# Patient Record
Sex: Male | Born: 1950
Health system: Southern US, Community
[De-identification: ages and names within clinical notes are randomized; demographics above are authoritative.]

## PROBLEM LIST (undated history)

## (undated) DIAGNOSIS — I219 Acute myocardial infarction, unspecified: Secondary | ICD-10-CM

## (undated) DIAGNOSIS — M1990 Unspecified osteoarthritis, unspecified site: Secondary | ICD-10-CM

## (undated) DIAGNOSIS — E669 Obesity, unspecified: Secondary | ICD-10-CM

## (undated) DIAGNOSIS — I2510 Atherosclerotic heart disease of native coronary artery without angina pectoris: Secondary | ICD-10-CM

## (undated) DIAGNOSIS — I10 Essential (primary) hypertension: Secondary | ICD-10-CM

## (undated) DIAGNOSIS — I739 Peripheral vascular disease, unspecified: Secondary | ICD-10-CM

## (undated) DIAGNOSIS — I259 Chronic ischemic heart disease, unspecified: Secondary | ICD-10-CM

## (undated) DIAGNOSIS — E785 Hyperlipidemia, unspecified: Secondary | ICD-10-CM

## (undated) DIAGNOSIS — I714 Abdominal aortic aneurysm, without rupture, unspecified: Secondary | ICD-10-CM

## (undated) HISTORY — DX: Hyperlipidemia, unspecified: E78.5

## (undated) HISTORY — PX: KNEE SURGERY: SHX244

## (undated) HISTORY — DX: Chronic ischemic heart disease, unspecified: I25.9

## (undated) HISTORY — DX: Unspecified osteoarthritis, unspecified site: M19.90

## (undated) HISTORY — DX: Obesity, unspecified: E66.9

## (undated) HISTORY — DX: Essential (primary) hypertension: I10

## (undated) HISTORY — DX: Abdominal aortic aneurysm, without rupture, unspecified: I71.40

---

## 2000-02-18 ENCOUNTER — Other Ambulatory Visit: Admission: RE | Admit: 2000-02-18 | Discharge: 2000-02-18 | Payer: Self-pay | Admitting: Oral Surgery

## 2000-07-27 ENCOUNTER — Encounter: Payer: Self-pay | Admitting: Emergency Medicine

## 2000-07-27 ENCOUNTER — Inpatient Hospital Stay (HOSPITAL_COMMUNITY): Admission: EM | Admit: 2000-07-27 | Discharge: 2000-07-28 | Payer: Self-pay | Admitting: Emergency Medicine

## 2000-07-28 ENCOUNTER — Encounter: Payer: Self-pay | Admitting: Cardiology

## 2001-04-29 HISTORY — PX: CORONARY ARTERY BYPASS GRAFT: SHX141

## 2001-11-13 ENCOUNTER — Ambulatory Visit (HOSPITAL_COMMUNITY): Admission: RE | Admit: 2001-11-13 | Discharge: 2001-11-13 | Payer: Self-pay | Admitting: Cardiology

## 2001-12-02 ENCOUNTER — Encounter: Payer: Self-pay | Admitting: Surgery

## 2001-12-04 ENCOUNTER — Encounter: Payer: Self-pay | Admitting: Surgery

## 2001-12-04 ENCOUNTER — Inpatient Hospital Stay (HOSPITAL_COMMUNITY): Admission: RE | Admit: 2001-12-04 | Discharge: 2001-12-08 | Payer: Self-pay | Admitting: Surgery

## 2001-12-05 ENCOUNTER — Encounter: Payer: Self-pay | Admitting: Thoracic Surgery (Cardiothoracic Vascular Surgery)

## 2001-12-05 ENCOUNTER — Encounter: Payer: Self-pay | Admitting: Surgery

## 2001-12-06 ENCOUNTER — Encounter: Payer: Self-pay | Admitting: Thoracic Surgery (Cardiothoracic Vascular Surgery)

## 2010-06-21 ENCOUNTER — Ambulatory Visit (INDEPENDENT_AMBULATORY_CARE_PROVIDER_SITE_OTHER): Payer: BC Managed Care – PPO | Admitting: Cardiology

## 2010-06-21 DIAGNOSIS — I2510 Atherosclerotic heart disease of native coronary artery without angina pectoris: Secondary | ICD-10-CM

## 2010-07-04 ENCOUNTER — Telehealth (INDEPENDENT_AMBULATORY_CARE_PROVIDER_SITE_OTHER): Payer: Self-pay | Admitting: Radiology

## 2010-07-05 ENCOUNTER — Ambulatory Visit (HOSPITAL_COMMUNITY): Payer: BC Managed Care – PPO | Attending: Cardiology

## 2010-07-05 ENCOUNTER — Encounter: Payer: Self-pay | Admitting: Cardiology

## 2010-07-05 DIAGNOSIS — R0989 Other specified symptoms and signs involving the circulatory and respiratory systems: Secondary | ICD-10-CM

## 2010-07-05 DIAGNOSIS — R0609 Other forms of dyspnea: Secondary | ICD-10-CM

## 2010-07-05 DIAGNOSIS — I2510 Atherosclerotic heart disease of native coronary artery without angina pectoris: Secondary | ICD-10-CM | POA: Insufficient documentation

## 2010-07-10 NOTE — Assessment & Plan Note (Signed)
Summary: Cardiology Nuclear Testing  Nuclear Med Background Indications for Stress Test: Evaluation for Ischemia, Graft Patency, PTCA Patency   History: Angioplasty, CABG, Heart Catheterization, Myocardial Infarction, Myocardial Perfusion Study  History Comments: '95 MI>Tx thrombolytics/PTCA; '03 CABG;  '09  MPS:no ischemia, EF=58%   Symptoms: DOE    Nuclear Pre-Procedure Cardiac Risk Factors: Family History - CAD, History of Smoking, Hypertension, Lipids Caffeine/Decaff Intake: None NPO After: 8:00 PM Lungs: Clear IV 0.9% NS with Angio Cath: 20g     IV Site: R Antecubital IV Started by: Bonnita Levan, RN Chest Size (in) 46     Height (in): 72 Weight (lb): 220 BMI: 29.95  Nuclear Med Study 1 or 2 day study:  1 day     Stress Test Type:  Stress Reading MD:  Willa Rough, MD     Referring MD:  Roger Shelter, MD Resting Radionuclide:  Technetium 62m Tetrofosmin     Resting Radionuclide Dose:  11 mCi  Stress Radionuclide:  Technetium 72m Tetrofosmin     Stress Radionuclide Dose:  33 mCi   Stress Protocol Exercise Time (min):  12:00 min     Max HR:  131 bpm     Predicted Max HR:  161 bpm  Max Systolic BP: 200 mm Hg     Percent Max HR:  81.37 %     METS: 13.7 Rate Pressure Product:  26200    Stress Test Technologist:  Rea College, CMA-N     Nuclear Technologist:  Doyne Keel, CNMT  Rest Procedure  Myocardial perfusion imaging was performed at rest 45 minutes following the intravenous administration of Technetium 28m Tetrofosmin.  Stress Procedure  The patient exercised for twelve minutes on the treadmill utilizing the Bruce protocol.  The patient stopped due to fatigue and denied any chest pain.  There were no diagnostic ST-T wave changes, only occasional PAC's.  He did have a mild hypertensive response to exercise, 200/87.  Technetium 20m Tetrofosmin was injected at peak exercise and myocardial perfusion imaging was performed after a brief delay.  QPS Raw Data Images:   Patient motion noted; appropriate software correction applied. Stress Images:  mild decrease in activity in the inferior wall. Rest Images:  Normal homogeneous uptake in all areas of the myocardium. Subtraction (SDS):  mild reversibility in the inferior wall. Transient Ischemic Dilatation:  .99  (Normal <1.22)  Lung/Heart Ratio:  .31  (Normal <0.45)  Quantitative Gated Spect Images QGS EDV:  113 ml QGS ESV:  46 ml QGS EF:  59 % QGS cine images:  Septal dyssynergy (CABG)  Findings Abnormal      Overall Impression  Exercise Capacity: Very good  BP Response: Hypertensive blood pressure response. Clinical Symptoms: No chest pain ECG Impression: No significant ST segment change suggestive of ischemia. Overall Impression Comments: The images show an area of mild inchemia in the inferior wall. The quantitative analysis does not show this as an abnormality. I can not rule out some inferior ischemia.

## 2010-07-10 NOTE — Progress Notes (Signed)
Summary: Nuclear Pre-Procedure  Phone Note Outgoing Call Call back at [phone]   Call placed by: Stanton Kidney, EMT-P,  July 04, 2010 11:02 AM Call placed to: Patient Action Taken: Phone Call Completed Reason for Call: Confirm/change Appt Summary of Call: Reviewed information on Myoview Information Sheet (see scanned document for further details).  Spoke with the patient. Stanton Kidney, EMT-P  July 04, 2010 11:02 AM      Nuclear Med Background Indications for Stress Test: Evaluation for Ischemia, Graft Patency, PTCA Patency   History: Angioplasty, CABG, Myocardial Infarction, Myocardial Perfusion Study  History Comments: '95 MI -Tx thrombolytics/ptca ; 8/03 Cabgx5; 1/09  MPS-EF=58% no isch.     Nuclear Pre-Procedure Cardiac Risk Factors: Family History - CAD, History of Smoking, Hypertension, Lipids Tech Comments: premature fam.hx.

## 2011-08-20 ENCOUNTER — Encounter: Payer: Self-pay | Admitting: *Deleted

## 2012-12-10 ENCOUNTER — Encounter: Payer: Self-pay | Admitting: Cardiology

## 2015-11-09 DIAGNOSIS — Z125 Encounter for screening for malignant neoplasm of prostate: Secondary | ICD-10-CM | POA: Diagnosis not present

## 2015-11-09 DIAGNOSIS — E782 Mixed hyperlipidemia: Secondary | ICD-10-CM | POA: Diagnosis not present

## 2015-11-09 DIAGNOSIS — R7301 Impaired fasting glucose: Secondary | ICD-10-CM | POA: Diagnosis not present

## 2015-11-14 DIAGNOSIS — R109 Unspecified abdominal pain: Secondary | ICD-10-CM | POA: Diagnosis not present

## 2015-11-14 DIAGNOSIS — R945 Abnormal results of liver function studies: Secondary | ICD-10-CM | POA: Diagnosis not present

## 2015-11-14 DIAGNOSIS — E782 Mixed hyperlipidemia: Secondary | ICD-10-CM | POA: Diagnosis not present

## 2015-11-14 DIAGNOSIS — R7301 Impaired fasting glucose: Secondary | ICD-10-CM | POA: Diagnosis not present

## 2015-11-14 DIAGNOSIS — I2510 Atherosclerotic heart disease of native coronary artery without angina pectoris: Secondary | ICD-10-CM | POA: Diagnosis not present

## 2016-04-26 DIAGNOSIS — R7301 Impaired fasting glucose: Secondary | ICD-10-CM | POA: Diagnosis not present

## 2016-04-26 DIAGNOSIS — E782 Mixed hyperlipidemia: Secondary | ICD-10-CM | POA: Diagnosis not present

## 2016-05-01 DIAGNOSIS — R945 Abnormal results of liver function studies: Secondary | ICD-10-CM | POA: Diagnosis not present

## 2016-05-01 DIAGNOSIS — Z0000 Encounter for general adult medical examination without abnormal findings: Secondary | ICD-10-CM | POA: Diagnosis not present

## 2016-05-01 DIAGNOSIS — E782 Mixed hyperlipidemia: Secondary | ICD-10-CM | POA: Diagnosis not present

## 2016-05-01 DIAGNOSIS — R7301 Impaired fasting glucose: Secondary | ICD-10-CM | POA: Diagnosis not present

## 2016-05-01 DIAGNOSIS — I2510 Atherosclerotic heart disease of native coronary artery without angina pectoris: Secondary | ICD-10-CM | POA: Diagnosis not present

## 2017-05-19 DIAGNOSIS — H524 Presbyopia: Secondary | ICD-10-CM | POA: Diagnosis not present

## 2017-05-19 DIAGNOSIS — H02831 Dermatochalasis of right upper eyelid: Secondary | ICD-10-CM | POA: Diagnosis not present

## 2017-05-19 DIAGNOSIS — H2513 Age-related nuclear cataract, bilateral: Secondary | ICD-10-CM | POA: Diagnosis not present

## 2017-05-19 DIAGNOSIS — H40053 Ocular hypertension, bilateral: Secondary | ICD-10-CM | POA: Diagnosis not present

## 2017-07-10 DIAGNOSIS — I2510 Atherosclerotic heart disease of native coronary artery without angina pectoris: Secondary | ICD-10-CM | POA: Diagnosis not present

## 2017-07-10 DIAGNOSIS — R945 Abnormal results of liver function studies: Secondary | ICD-10-CM | POA: Diagnosis not present

## 2017-07-10 DIAGNOSIS — Z125 Encounter for screening for malignant neoplasm of prostate: Secondary | ICD-10-CM | POA: Diagnosis not present

## 2017-07-10 DIAGNOSIS — I10 Essential (primary) hypertension: Secondary | ICD-10-CM | POA: Diagnosis not present

## 2017-07-10 DIAGNOSIS — R7301 Impaired fasting glucose: Secondary | ICD-10-CM | POA: Diagnosis not present

## 2017-07-16 DIAGNOSIS — E782 Mixed hyperlipidemia: Secondary | ICD-10-CM | POA: Diagnosis not present

## 2017-07-16 DIAGNOSIS — I252 Old myocardial infarction: Secondary | ICD-10-CM | POA: Diagnosis not present

## 2017-07-16 DIAGNOSIS — R7301 Impaired fasting glucose: Secondary | ICD-10-CM | POA: Diagnosis not present

## 2017-07-16 DIAGNOSIS — R945 Abnormal results of liver function studies: Secondary | ICD-10-CM | POA: Diagnosis not present

## 2017-07-16 DIAGNOSIS — Z0000 Encounter for general adult medical examination without abnormal findings: Secondary | ICD-10-CM | POA: Diagnosis not present

## 2017-07-16 DIAGNOSIS — I10 Essential (primary) hypertension: Secondary | ICD-10-CM | POA: Diagnosis not present

## 2017-10-01 DIAGNOSIS — R69 Illness, unspecified: Secondary | ICD-10-CM | POA: Diagnosis not present

## 2017-11-17 DIAGNOSIS — H40053 Ocular hypertension, bilateral: Secondary | ICD-10-CM | POA: Diagnosis not present

## 2017-12-15 DIAGNOSIS — E782 Mixed hyperlipidemia: Secondary | ICD-10-CM | POA: Diagnosis not present

## 2017-12-15 DIAGNOSIS — R945 Abnormal results of liver function studies: Secondary | ICD-10-CM | POA: Diagnosis not present

## 2017-12-15 DIAGNOSIS — R7301 Impaired fasting glucose: Secondary | ICD-10-CM | POA: Diagnosis not present

## 2017-12-17 DIAGNOSIS — E6609 Other obesity due to excess calories: Secondary | ICD-10-CM | POA: Diagnosis not present

## 2017-12-17 DIAGNOSIS — L219 Seborrheic dermatitis, unspecified: Secondary | ICD-10-CM | POA: Diagnosis not present

## 2017-12-17 DIAGNOSIS — E782 Mixed hyperlipidemia: Secondary | ICD-10-CM | POA: Diagnosis not present

## 2017-12-17 DIAGNOSIS — R7301 Impaired fasting glucose: Secondary | ICD-10-CM | POA: Diagnosis not present

## 2017-12-17 DIAGNOSIS — I2510 Atherosclerotic heart disease of native coronary artery without angina pectoris: Secondary | ICD-10-CM | POA: Diagnosis not present

## 2017-12-17 DIAGNOSIS — Z6829 Body mass index (BMI) 29.0-29.9, adult: Secondary | ICD-10-CM | POA: Diagnosis not present

## 2017-12-17 DIAGNOSIS — R945 Abnormal results of liver function studies: Secondary | ICD-10-CM | POA: Diagnosis not present

## 2018-02-04 DIAGNOSIS — R1032 Left lower quadrant pain: Secondary | ICD-10-CM | POA: Diagnosis not present

## 2018-02-04 DIAGNOSIS — Z1210 Encounter for screening for malignant neoplasm of intestinal tract, unspecified: Secondary | ICD-10-CM | POA: Diagnosis not present

## 2018-03-23 DIAGNOSIS — Z1211 Encounter for screening for malignant neoplasm of colon: Secondary | ICD-10-CM | POA: Diagnosis not present

## 2018-03-23 DIAGNOSIS — D126 Benign neoplasm of colon, unspecified: Secondary | ICD-10-CM | POA: Diagnosis not present

## 2018-03-23 DIAGNOSIS — K5730 Diverticulosis of large intestine without perforation or abscess without bleeding: Secondary | ICD-10-CM | POA: Diagnosis not present

## 2018-03-23 DIAGNOSIS — K635 Polyp of colon: Secondary | ICD-10-CM | POA: Diagnosis not present

## 2018-03-24 DIAGNOSIS — K635 Polyp of colon: Secondary | ICD-10-CM | POA: Diagnosis not present

## 2018-03-24 DIAGNOSIS — D126 Benign neoplasm of colon, unspecified: Secondary | ICD-10-CM | POA: Diagnosis not present

## 2018-06-24 DIAGNOSIS — E6609 Other obesity due to excess calories: Secondary | ICD-10-CM | POA: Diagnosis not present

## 2018-06-24 DIAGNOSIS — R945 Abnormal results of liver function studies: Secondary | ICD-10-CM | POA: Diagnosis not present

## 2018-06-24 DIAGNOSIS — E782 Mixed hyperlipidemia: Secondary | ICD-10-CM | POA: Diagnosis not present

## 2018-06-24 DIAGNOSIS — R7301 Impaired fasting glucose: Secondary | ICD-10-CM | POA: Diagnosis not present

## 2018-06-24 DIAGNOSIS — Z125 Encounter for screening for malignant neoplasm of prostate: Secondary | ICD-10-CM | POA: Diagnosis not present

## 2018-07-30 DIAGNOSIS — I252 Old myocardial infarction: Secondary | ICD-10-CM | POA: Diagnosis not present

## 2018-07-30 DIAGNOSIS — R7301 Impaired fasting glucose: Secondary | ICD-10-CM | POA: Diagnosis not present

## 2018-07-30 DIAGNOSIS — I2510 Atherosclerotic heart disease of native coronary artery without angina pectoris: Secondary | ICD-10-CM | POA: Diagnosis not present

## 2018-07-30 DIAGNOSIS — L219 Seborrheic dermatitis, unspecified: Secondary | ICD-10-CM | POA: Diagnosis not present

## 2018-07-30 DIAGNOSIS — Z0000 Encounter for general adult medical examination without abnormal findings: Secondary | ICD-10-CM | POA: Diagnosis not present

## 2018-07-30 DIAGNOSIS — E782 Mixed hyperlipidemia: Secondary | ICD-10-CM | POA: Diagnosis not present

## 2018-09-08 DIAGNOSIS — Z0000 Encounter for general adult medical examination without abnormal findings: Secondary | ICD-10-CM | POA: Diagnosis not present

## 2018-11-26 ENCOUNTER — Other Ambulatory Visit: Payer: Self-pay

## 2019-05-07 DIAGNOSIS — Z951 Presence of aortocoronary bypass graft: Secondary | ICD-10-CM | POA: Insufficient documentation

## 2019-05-07 DIAGNOSIS — Z0000 Encounter for general adult medical examination without abnormal findings: Secondary | ICD-10-CM | POA: Diagnosis not present

## 2019-05-07 DIAGNOSIS — I2510 Atherosclerotic heart disease of native coronary artery without angina pectoris: Secondary | ICD-10-CM | POA: Insufficient documentation

## 2019-05-07 DIAGNOSIS — Z23 Encounter for immunization: Secondary | ICD-10-CM | POA: Diagnosis not present

## 2019-05-07 DIAGNOSIS — E785 Hyperlipidemia, unspecified: Secondary | ICD-10-CM | POA: Insufficient documentation

## 2019-05-07 DIAGNOSIS — Z136 Encounter for screening for cardiovascular disorders: Secondary | ICD-10-CM | POA: Diagnosis not present

## 2019-05-27 ENCOUNTER — Other Ambulatory Visit: Payer: Self-pay | Admitting: Family Medicine

## 2019-05-27 DIAGNOSIS — Z136 Encounter for screening for cardiovascular disorders: Secondary | ICD-10-CM

## 2019-05-31 ENCOUNTER — Ambulatory Visit
Admission: RE | Admit: 2019-05-31 | Discharge: 2019-05-31 | Disposition: A | Discharge status: Home / Self Care | Payer: Medicare HMO | Source: Ambulatory Visit | Attending: Family Medicine | Admitting: Family Medicine

## 2019-05-31 DIAGNOSIS — Z136 Encounter for screening for cardiovascular disorders: Secondary | ICD-10-CM | POA: Diagnosis not present

## 2019-06-16 DIAGNOSIS — M545 Low back pain: Secondary | ICD-10-CM | POA: Diagnosis not present

## 2019-06-16 DIAGNOSIS — I714 Abdominal aortic aneurysm, without rupture: Secondary | ICD-10-CM | POA: Diagnosis not present

## 2019-06-20 ENCOUNTER — Ambulatory Visit: Payer: Medicare HMO | Attending: Internal Medicine

## 2019-06-20 DIAGNOSIS — Z23 Encounter for immunization: Secondary | ICD-10-CM

## 2019-06-20 NOTE — Progress Notes (Signed)
   Covid-19 Vaccination Clinic  Name:  Kjuan Blocher    MRN: [id number] DOB: [birth year]  06/20/2019  Mr. Seelman was observed post Covid-19 immunization for 15 minutes without incidence. He was provided with Vaccine Information Sheet and instruction to access the V-Safe system.   Mr. Berst was instructed to call 911 with any severe reactions post vaccine: Marland Kitchen Difficulty breathing  . Swelling of your face and throat  . A fast heartbeat  . A bad rash all over your body  . Dizziness and weakness    Immunizations Administered    Name Date Dose VIS Date Route   Pfizer COVID-19 Vaccine 06/20/2019 10:39 AM 0.3 mL 04/09/2019 Intramuscular   Manufacturer: [place]   Lot: [id number]   [place]: [id number]

## 2019-06-29 DIAGNOSIS — I2510 Atherosclerotic heart disease of native coronary artery without angina pectoris: Secondary | ICD-10-CM | POA: Diagnosis not present

## 2019-06-29 DIAGNOSIS — M25559 Pain in unspecified hip: Secondary | ICD-10-CM | POA: Diagnosis not present

## 2019-06-29 DIAGNOSIS — M545 Low back pain: Secondary | ICD-10-CM | POA: Diagnosis not present

## 2019-06-30 DIAGNOSIS — M5137 Other intervertebral disc degeneration, lumbosacral region: Secondary | ICD-10-CM | POA: Diagnosis not present

## 2019-06-30 DIAGNOSIS — M5136 Other intervertebral disc degeneration, lumbar region: Secondary | ICD-10-CM | POA: Diagnosis not present

## 2019-06-30 DIAGNOSIS — M47817 Spondylosis without myelopathy or radiculopathy, lumbosacral region: Secondary | ICD-10-CM | POA: Diagnosis not present

## 2019-06-30 DIAGNOSIS — M160 Bilateral primary osteoarthritis of hip: Secondary | ICD-10-CM | POA: Diagnosis not present

## 2019-06-30 DIAGNOSIS — M47816 Spondylosis without myelopathy or radiculopathy, lumbar region: Secondary | ICD-10-CM | POA: Diagnosis not present

## 2019-07-13 ENCOUNTER — Encounter: Payer: Self-pay | Admitting: Vascular Surgery

## 2019-07-13 ENCOUNTER — Ambulatory Visit (INDEPENDENT_AMBULATORY_CARE_PROVIDER_SITE_OTHER): Payer: Medicare HMO | Admitting: Vascular Surgery

## 2019-07-13 ENCOUNTER — Other Ambulatory Visit: Payer: Self-pay

## 2019-07-13 VITALS — BP 118/78 | HR 89 | Temp 97.2°F | Resp 20 | Ht 72.0 in | Wt 202.0 lb

## 2019-07-13 DIAGNOSIS — I714 Abdominal aortic aneurysm, without rupture, unspecified: Secondary | ICD-10-CM

## 2019-07-13 NOTE — Progress Notes (Signed)
Vascular and Vein Specialist of [facility]  Patient name: David Decker MRN: [id number] DOB: 01-19-1951 Sex: male  REASON FOR CONSULT: Discuss new diagnosis of abdominal aortic aneurysm  HPI: David Decker is a 69 y.o. male, who is here today for discussion of recent ultrasound demonstrating abdominal aortic aneurysm.  He had no known prior knowledge of this.  He has no history of aneurysms in his family.  He had severe coronary disease at an Josia Cueva age and had coronary artery bypass grafting in his Lauriel Helin 43s.  He has remained stable regarding this.  He has no history of peripheral vascular occlusive disease.  He is quite active.  Prior to Covid he was routinely going to the gym and he plays golf several times per week.  Past Medical History:  Diagnosis Date  . Arthritis   . HLD (hyperlipidemia)   . HTN (hypertension)   . Ischemic heart disease   . Obesity     Family History  Problem Relation Age of Onset  . Parkinsonism Other   . Heart attack Other     SOCIAL HISTORY: Social History   Socioeconomic History  . Marital status: Married    Spouse name: Not on file  . Number of children: 3  . Years of education: Not on file  . Highest education level: Not on file  Occupational History  . Occupation: Medical sales representative  Tobacco Use  . Smoking status: Current Some Day Smoker    Types: Cigars  . Smokeless tobacco: Never Used  Substance and Sexual Activity  . Alcohol use: Yes    Comment: occasionally  . Drug use: Not on file  . Sexual activity: Not on file  Other Topics Concern  . Not on file  Social History Narrative  . Not on file   Social Determinants of Health   Financial Resource Strain:   . Difficulty of Paying Living Expenses:   Food Insecurity:   . Worried About Charity fundraiser in the Last Year:   . Arboriculturist in the Last Year:   Transportation Needs:   . Film/video editor (Medical):   Marland Kitchen Lack of  Transportation (Non-Medical):   Physical Activity:   . Days of Exercise per Week:   . Minutes of Exercise per Session:   Stress:   . Feeling of Stress :   Social Connections:   . Frequency of Communication with Friends and Family:   . Frequency of Social Gatherings with Friends and Family:   . Attends Religious Services:   . Active Member of Clubs or Organizations:   . Attends Archivist Meetings:   Marland Kitchen Marital Status:   Intimate Partner Violence:   . Fear of Current or Ex-Partner:   . Emotionally Abused:   Marland Kitchen Physically Abused:   . Sexually Abused:     Allergies  Allergen Reactions  . Other Hives    walnuts  . Penicillins     Current Outpatient Medications  Medication Sig Dispense Refill  . aspirin 81 MG EC tablet Take by mouth.    Marland Kitchen ibuprofen (ADVIL) 200 MG tablet Take by mouth.    . rosuvastatin (CRESTOR) 20 MG tablet Take by mouth.    . cyclobenzaprine (FLEXERIL) 5 MG tablet Take by mouth.     No current facility-administered medications for this visit.    REVIEW OF SYSTEMS:  [X]  denotes positive finding, [ ]  denotes negative finding Cardiac  Comments:  Chest pain or chest pressure:  Shortness of breath upon exertion:    Short of breath when lying flat:    Irregular heart rhythm:        Vascular    Pain in calf, thigh, or hip brought on by ambulation:    Pain in feet at night that wakes you up from your sleep:     Blood clot in your veins:    Leg swelling:         Pulmonary    Oxygen at home:    Productive cough:     Wheezing:         Neurologic    Sudden weakness in arms or legs:     Sudden numbness in arms or legs:     Sudden onset of difficulty speaking or slurred speech:    Temporary loss of vision in one eye:     Problems with dizziness:         Gastrointestinal    Blood in stool:     Vomited blood:         Genitourinary    Burning when urinating:     Blood in urine:        Psychiatric    Major depression:         Hematologic     Bleeding problems:    Problems with blood clotting too easily:        Skin    Rashes or ulcers:        Constitutional    Fever or chills:      PHYSICAL EXAM: Vitals:   07/13/19 0937  BP: 118/78  Pulse: 89  Resp: 20  Temp: (!) 97.2 F (36.2 C)  SpO2: 99%  Weight: 91.6 kg  Height: 6' (1.829 m)    GENERAL: The patient is a well-nourished male, in no acute distress. The vital signs are documented above. CARDIOVASCULAR: Carotid arteries without bruits bilaterally.  2+ radial 2+ femoral 2+ popliteal and 2+ posterior tibial pulses bilaterally with no evidence of peripheral artery aneurysm. PULMONARY: There is good air exchange  ABDOMEN: Soft and non-tender.  I do not palpate an aneurysm MUSCULOSKELETAL: There are no major deformities or cyanosis. NEUROLOGIC: No focal weakness or paresthesias are detected. SKIN: There are no ulcers or rashes noted. PSYCHIATRIC: The patient has a normal affect.  DATA:  I reviewed his ultrasound.  This reveals a 4.6 cm abdominal aortic aneurysm.  He does have some ectasia of his common iliac arteries bilaterally at 1.3 on the right and 1.4 on the left  MEDICAL ISSUES: I had a very long discussion with the patient regarding this.  I explained that this is very common diagnosis with concern regarding expansion and possible rupture.  I did explain that our current recommendation for repair is 5 to 5-1/2 cm in diameter depending on age and other comorbidities.  I explained the importance of blood pressure control but that he does not need to restrict his activities otherwise.  I did explain symptoms of leaking aneurysm and the need to call 911 and present to [facility] immediately should this occur.  We will see him in 6 months with CT scan of his abdomen and pelvis.  If this shows no appreciable growth, will then follow him with ultrasound at 31-month intervals.   Rosetta Posner, MD FACS [facility] of [facility] Tel [phone] Pager [phone]

## 2019-07-14 ENCOUNTER — Ambulatory Visit: Payer: Medicare HMO | Attending: Internal Medicine

## 2019-07-14 DIAGNOSIS — Z23 Encounter for immunization: Secondary | ICD-10-CM

## 2019-07-14 NOTE — Progress Notes (Signed)
   Covid-19 Vaccination Clinic  Name:  David Decker    MRN: [id number] DOB: 1951/04/07  07/14/2019  [name] was observed post Covid-19 immunization for 15 minutes without incident. He was provided with Vaccine Information Sheet and instruction to access the V-Safe system.   [name] was instructed to call 911 with any severe reactions post vaccine: Marland Kitchen Difficulty breathing  . Swelling of face and throat  . A fast heartbeat  . A bad rash all over body  . Dizziness and weakness   Immunizations Administered    Name Date Dose VIS Date Route   Pfizer COVID-19 Vaccine 07/14/2019  9:07 AM 0.3 mL 04/09/2019 Intramuscular   Manufacturer: [place]   Lot: [id number]   [place]: [id number]

## 2019-08-03 DIAGNOSIS — R69 Illness, unspecified: Secondary | ICD-10-CM | POA: Diagnosis not present

## 2019-08-03 DIAGNOSIS — M778 Other enthesopathies, not elsewhere classified: Secondary | ICD-10-CM | POA: Diagnosis not present

## 2019-08-03 DIAGNOSIS — M545 Low back pain: Secondary | ICD-10-CM | POA: Diagnosis not present

## 2019-08-10 DIAGNOSIS — M5117 Intervertebral disc disorders with radiculopathy, lumbosacral region: Secondary | ICD-10-CM | POA: Diagnosis not present

## 2019-08-10 DIAGNOSIS — M4727 Other spondylosis with radiculopathy, lumbosacral region: Secondary | ICD-10-CM | POA: Diagnosis not present

## 2019-08-10 DIAGNOSIS — M4726 Other spondylosis with radiculopathy, lumbar region: Secondary | ICD-10-CM | POA: Diagnosis not present

## 2019-08-10 DIAGNOSIS — M5116 Intervertebral disc disorders with radiculopathy, lumbar region: Secondary | ICD-10-CM | POA: Diagnosis not present

## 2019-08-10 DIAGNOSIS — M545 Low back pain: Secondary | ICD-10-CM | POA: Diagnosis not present

## 2019-08-12 DIAGNOSIS — Z951 Presence of aortocoronary bypass graft: Secondary | ICD-10-CM | POA: Diagnosis not present

## 2019-08-12 DIAGNOSIS — E785 Hyperlipidemia, unspecified: Secondary | ICD-10-CM | POA: Diagnosis not present

## 2019-08-12 DIAGNOSIS — M545 Low back pain: Secondary | ICD-10-CM | POA: Diagnosis not present

## 2019-08-25 DIAGNOSIS — M5136 Other intervertebral disc degeneration, lumbar region: Secondary | ICD-10-CM | POA: Diagnosis not present

## 2019-08-25 DIAGNOSIS — M48061 Spinal stenosis, lumbar region without neurogenic claudication: Secondary | ICD-10-CM | POA: Diagnosis not present

## 2019-08-25 DIAGNOSIS — M4802 Spinal stenosis, cervical region: Secondary | ICD-10-CM | POA: Diagnosis not present

## 2019-08-25 DIAGNOSIS — M4722 Other spondylosis with radiculopathy, cervical region: Secondary | ICD-10-CM | POA: Diagnosis not present

## 2019-08-25 DIAGNOSIS — M5030 Other cervical disc degeneration, unspecified cervical region: Secondary | ICD-10-CM | POA: Diagnosis not present

## 2019-08-25 DIAGNOSIS — M4726 Other spondylosis with radiculopathy, lumbar region: Secondary | ICD-10-CM | POA: Diagnosis not present

## 2019-08-25 DIAGNOSIS — G894 Chronic pain syndrome: Secondary | ICD-10-CM | POA: Diagnosis not present

## 2019-08-25 DIAGNOSIS — M545 Low back pain: Secondary | ICD-10-CM | POA: Diagnosis not present

## 2019-08-31 DIAGNOSIS — M50321 Other cervical disc degeneration at C4-C5 level: Secondary | ICD-10-CM | POA: Diagnosis not present

## 2019-09-01 DIAGNOSIS — H2513 Age-related nuclear cataract, bilateral: Secondary | ICD-10-CM | POA: Diagnosis not present

## 2019-09-01 DIAGNOSIS — H524 Presbyopia: Secondary | ICD-10-CM | POA: Diagnosis not present

## 2019-09-01 DIAGNOSIS — H25013 Cortical age-related cataract, bilateral: Secondary | ICD-10-CM | POA: Diagnosis not present

## 2019-09-01 DIAGNOSIS — H52201 Unspecified astigmatism, right eye: Secondary | ICD-10-CM | POA: Diagnosis not present

## 2019-09-15 DIAGNOSIS — M4722 Other spondylosis with radiculopathy, cervical region: Secondary | ICD-10-CM | POA: Diagnosis not present

## 2019-09-15 DIAGNOSIS — M4726 Other spondylosis with radiculopathy, lumbar region: Secondary | ICD-10-CM | POA: Diagnosis not present

## 2019-09-15 DIAGNOSIS — M5136 Other intervertebral disc degeneration, lumbar region: Secondary | ICD-10-CM | POA: Diagnosis not present

## 2019-10-11 DIAGNOSIS — M4722 Other spondylosis with radiculopathy, cervical region: Secondary | ICD-10-CM | POA: Diagnosis not present

## 2019-10-11 DIAGNOSIS — M6281 Muscle weakness (generalized): Secondary | ICD-10-CM | POA: Diagnosis not present

## 2019-10-11 DIAGNOSIS — M5136 Other intervertebral disc degeneration, lumbar region: Secondary | ICD-10-CM | POA: Diagnosis not present

## 2019-10-11 DIAGNOSIS — M4726 Other spondylosis with radiculopathy, lumbar region: Secondary | ICD-10-CM | POA: Diagnosis not present

## 2019-10-19 DIAGNOSIS — M4722 Other spondylosis with radiculopathy, cervical region: Secondary | ICD-10-CM | POA: Diagnosis not present

## 2019-10-19 DIAGNOSIS — M6281 Muscle weakness (generalized): Secondary | ICD-10-CM | POA: Diagnosis not present

## 2019-10-19 DIAGNOSIS — M4726 Other spondylosis with radiculopathy, lumbar region: Secondary | ICD-10-CM | POA: Diagnosis not present

## 2019-10-19 DIAGNOSIS — M5136 Other intervertebral disc degeneration, lumbar region: Secondary | ICD-10-CM | POA: Diagnosis not present

## 2019-10-21 DIAGNOSIS — M5136 Other intervertebral disc degeneration, lumbar region: Secondary | ICD-10-CM | POA: Diagnosis not present

## 2019-10-21 DIAGNOSIS — M79642 Pain in left hand: Secondary | ICD-10-CM | POA: Diagnosis not present

## 2019-10-21 DIAGNOSIS — M5030 Other cervical disc degeneration, unspecified cervical region: Secondary | ICD-10-CM | POA: Diagnosis not present

## 2019-10-21 DIAGNOSIS — M25532 Pain in left wrist: Secondary | ICD-10-CM | POA: Diagnosis not present

## 2019-10-21 DIAGNOSIS — M4722 Other spondylosis with radiculopathy, cervical region: Secondary | ICD-10-CM | POA: Diagnosis not present

## 2019-10-21 DIAGNOSIS — M25531 Pain in right wrist: Secondary | ICD-10-CM | POA: Diagnosis not present

## 2019-10-21 DIAGNOSIS — M4726 Other spondylosis with radiculopathy, lumbar region: Secondary | ICD-10-CM | POA: Diagnosis not present

## 2019-10-21 DIAGNOSIS — M6281 Muscle weakness (generalized): Secondary | ICD-10-CM | POA: Diagnosis not present

## 2019-10-21 DIAGNOSIS — M79641 Pain in right hand: Secondary | ICD-10-CM | POA: Diagnosis not present

## 2019-10-25 DIAGNOSIS — M4722 Other spondylosis with radiculopathy, cervical region: Secondary | ICD-10-CM | POA: Diagnosis not present

## 2019-10-25 DIAGNOSIS — M6281 Muscle weakness (generalized): Secondary | ICD-10-CM | POA: Diagnosis not present

## 2019-10-25 DIAGNOSIS — M4726 Other spondylosis with radiculopathy, lumbar region: Secondary | ICD-10-CM | POA: Diagnosis not present

## 2019-10-25 DIAGNOSIS — M5136 Other intervertebral disc degeneration, lumbar region: Secondary | ICD-10-CM | POA: Diagnosis not present

## 2019-10-28 DIAGNOSIS — M4722 Other spondylosis with radiculopathy, cervical region: Secondary | ICD-10-CM | POA: Diagnosis not present

## 2019-10-28 DIAGNOSIS — M4726 Other spondylosis with radiculopathy, lumbar region: Secondary | ICD-10-CM | POA: Diagnosis not present

## 2019-10-28 DIAGNOSIS — M5136 Other intervertebral disc degeneration, lumbar region: Secondary | ICD-10-CM | POA: Diagnosis not present

## 2019-11-08 DIAGNOSIS — M7989 Other specified soft tissue disorders: Secondary | ICD-10-CM | POA: Diagnosis not present

## 2019-11-09 DIAGNOSIS — M4722 Other spondylosis with radiculopathy, cervical region: Secondary | ICD-10-CM | POA: Diagnosis not present

## 2019-11-09 DIAGNOSIS — M4802 Spinal stenosis, cervical region: Secondary | ICD-10-CM | POA: Diagnosis not present

## 2019-11-09 DIAGNOSIS — M5030 Other cervical disc degeneration, unspecified cervical region: Secondary | ICD-10-CM | POA: Diagnosis not present

## 2019-11-11 DIAGNOSIS — M5136 Other intervertebral disc degeneration, lumbar region: Secondary | ICD-10-CM | POA: Diagnosis not present

## 2019-11-11 DIAGNOSIS — M4722 Other spondylosis with radiculopathy, cervical region: Secondary | ICD-10-CM | POA: Diagnosis not present

## 2019-11-11 DIAGNOSIS — M4726 Other spondylosis with radiculopathy, lumbar region: Secondary | ICD-10-CM | POA: Diagnosis not present

## 2019-11-15 DIAGNOSIS — I2510 Atherosclerotic heart disease of native coronary artery without angina pectoris: Secondary | ICD-10-CM | POA: Diagnosis not present

## 2019-11-15 DIAGNOSIS — M130 Polyarthritis, unspecified: Secondary | ICD-10-CM | POA: Diagnosis not present

## 2019-11-15 DIAGNOSIS — M7501 Adhesive capsulitis of right shoulder: Secondary | ICD-10-CM | POA: Diagnosis not present

## 2019-11-15 DIAGNOSIS — D649 Anemia, unspecified: Secondary | ICD-10-CM | POA: Diagnosis not present

## 2019-11-15 DIAGNOSIS — M25559 Pain in unspecified hip: Secondary | ICD-10-CM | POA: Diagnosis not present

## 2019-11-15 DIAGNOSIS — R634 Abnormal weight loss: Secondary | ICD-10-CM | POA: Diagnosis not present

## 2019-11-15 DIAGNOSIS — E785 Hyperlipidemia, unspecified: Secondary | ICD-10-CM | POA: Diagnosis not present

## 2019-11-15 DIAGNOSIS — Z720 Tobacco use: Secondary | ICD-10-CM | POA: Diagnosis not present

## 2019-11-15 DIAGNOSIS — M7502 Adhesive capsulitis of left shoulder: Secondary | ICD-10-CM | POA: Diagnosis not present

## 2019-11-17 DIAGNOSIS — I714 Abdominal aortic aneurysm, without rupture: Secondary | ICD-10-CM | POA: Diagnosis not present

## 2019-11-17 DIAGNOSIS — R634 Abnormal weight loss: Secondary | ICD-10-CM | POA: Diagnosis not present

## 2019-11-17 DIAGNOSIS — K6389 Other specified diseases of intestine: Secondary | ICD-10-CM | POA: Diagnosis not present

## 2019-11-25 DIAGNOSIS — Z6822 Body mass index (BMI) 22.0-22.9, adult: Secondary | ICD-10-CM | POA: Diagnosis not present

## 2019-11-25 DIAGNOSIS — R5382 Chronic fatigue, unspecified: Secondary | ICD-10-CM | POA: Diagnosis not present

## 2019-11-25 DIAGNOSIS — M2550 Pain in unspecified joint: Secondary | ICD-10-CM | POA: Diagnosis not present

## 2019-11-26 DIAGNOSIS — R634 Abnormal weight loss: Secondary | ICD-10-CM | POA: Diagnosis not present

## 2019-11-26 DIAGNOSIS — R933 Abnormal findings on diagnostic imaging of other parts of digestive tract: Secondary | ICD-10-CM | POA: Diagnosis not present

## 2019-11-26 DIAGNOSIS — K5900 Constipation, unspecified: Secondary | ICD-10-CM | POA: Diagnosis not present

## 2019-11-30 DIAGNOSIS — R0981 Nasal congestion: Secondary | ICD-10-CM | POA: Diagnosis not present

## 2019-11-30 DIAGNOSIS — J029 Acute pharyngitis, unspecified: Secondary | ICD-10-CM | POA: Diagnosis not present

## 2019-12-07 DIAGNOSIS — M4722 Other spondylosis with radiculopathy, cervical region: Secondary | ICD-10-CM | POA: Diagnosis not present

## 2019-12-07 DIAGNOSIS — M4802 Spinal stenosis, cervical region: Secondary | ICD-10-CM | POA: Diagnosis not present

## 2019-12-07 DIAGNOSIS — M5030 Other cervical disc degeneration, unspecified cervical region: Secondary | ICD-10-CM | POA: Diagnosis not present

## 2019-12-09 ENCOUNTER — Telehealth: Payer: Self-pay | Admitting: Hematology

## 2019-12-09 NOTE — Telephone Encounter (Signed)
Received a new pt referral from [facility] rheumatology for for evaluation of weight loss and M-Spike on SPEP. David Decker has been scheduled to see Dr. Irene Limbo on 8/17 at 11am. Appt date and time has been given to the pt's wife.

## 2019-12-14 ENCOUNTER — Inpatient Hospital Stay: Payer: Medicare HMO | Attending: Hematology | Admitting: Hematology

## 2019-12-14 ENCOUNTER — Telehealth: Payer: Self-pay | Admitting: Hematology

## 2019-12-14 ENCOUNTER — Other Ambulatory Visit: Payer: Self-pay

## 2019-12-14 ENCOUNTER — Inpatient Hospital Stay: Payer: Medicare HMO

## 2019-12-14 VITALS — BP 139/90 | HR 83 | Temp 97.6°F | Resp 18 | Ht 72.0 in | Wt 189.7 lb

## 2019-12-14 DIAGNOSIS — R69 Illness, unspecified: Secondary | ICD-10-CM | POA: Diagnosis not present

## 2019-12-14 DIAGNOSIS — Z803 Family history of malignant neoplasm of breast: Secondary | ICD-10-CM

## 2019-12-14 DIAGNOSIS — D472 Monoclonal gammopathy: Secondary | ICD-10-CM

## 2019-12-14 DIAGNOSIS — Z951 Presence of aortocoronary bypass graft: Secondary | ICD-10-CM

## 2019-12-14 DIAGNOSIS — F17210 Nicotine dependence, cigarettes, uncomplicated: Secondary | ICD-10-CM | POA: Diagnosis not present

## 2019-12-14 LAB — CMP (CANCER CENTER ONLY)
ALT: 19 U/L (ref 0–44)
AST: 13 U/L — ABNORMAL LOW (ref 15–41)
Albumin: 3.9 g/dL (ref 3.5–5.0)
Alkaline Phosphatase: 55 U/L (ref 38–126)
Anion gap: 9 (ref 5–15)
BUN: 10 mg/dL (ref 8–23)
CO2: 28 mmol/L (ref 22–32)
Calcium: 10.3 mg/dL (ref 8.9–10.3)
Chloride: 103 mmol/L (ref 98–111)
Creatinine: 0.98 mg/dL (ref 0.61–1.24)
GFR, Est AFR Am: >60 mL/min (ref >60)
GFR, Estimated: >60 mL/min (ref >60)
Glucose, Bld: 147 mg/dL — ABNORMAL HIGH (ref 70–99)
Potassium: 4.1 mmol/L (ref 3.5–5.1)
Sodium: 140 mmol/L (ref 135–145)
Total Bilirubin: 0.6 mg/dL (ref 0.3–1.2)
Total Protein: 7.7 g/dL (ref 6.5–8.1)

## 2019-12-14 LAB — SEDIMENTATION RATE: Sed Rate: 53 mm/hr — ABNORMAL HIGH (ref 0–16)

## 2019-12-14 LAB — CBC WITH DIFFERENTIAL/PLATELET
Abs Immature Granulocytes: 0.12 10*3/uL — ABNORMAL HIGH (ref 0.00–0.07)
Basophils Absolute: 0.1 10*3/uL (ref 0.0–0.1)
Basophils Relative: 1 %
Eosinophils Absolute: 0.1 10*3/uL (ref 0.0–0.5)
Eosinophils Relative: 1 %
HCT: 46.1 % (ref 39.0–52.0)
Hemoglobin: 15.2 g/dL (ref 13.0–17.0)
Immature Granulocytes: 1 %
Lymphocytes Relative: 8 %
Lymphs Abs: 0.7 10*3/uL (ref 0.7–4.0)
MCH: 30.4 pg (ref 26.0–34.0)
MCHC: 33 g/dL (ref 30.0–36.0)
MCV: 92.2 fL (ref 80.0–100.0)
Monocytes Absolute: 0.4 10*3/uL (ref 0.1–1.0)
Monocytes Relative: 5 %
Neutro Abs: 7.3 10*3/uL (ref 1.7–7.7)
Neutrophils Relative %: 84 %
Platelets: 287 10*3/uL (ref 150–400)
RBC: 5 MIL/uL (ref 4.22–5.81)
RDW: 14 % (ref 11.5–15.5)
WBC: 8.6 10*3/uL (ref 4.0–10.5)
nRBC: 0 % (ref 0.0–0.2)

## 2019-12-14 LAB — LACTATE DEHYDROGENASE: LDH: 174 U/L (ref 98–192)

## 2019-12-14 NOTE — Telephone Encounter (Signed)
Scheduled per los. Declined printout  

## 2019-12-14 NOTE — Patient Instructions (Signed)
Thank you for choosing [facility] to provide your oncology and hematology care.   Should you have questions after your visit to the [facility] ([facility]), please contact this office at [phone] between 8:30 AM and 4:30 PM.  Voice mails left after 4:00 PM may not be returned until the following business day.  Calls received after 4:30 PM will be answered by an off-site Nurse Triage Line.    Prescription Refills:  Please have your pharmacy contact us directly for most prescription requests.  Contact the office directly for refills of narcotics (pain medications). Allow 48-72 hours for refills.  Appointments: Please contact the [facility] scheduling department [phone] for questions regarding [facility] appointment scheduling.  Contact the schedulers with any scheduling changes so that your appointment can be rescheduled in a timely manner.   Central Scheduling for [facility] [phone] - Call to schedule procedures such as PET scans, CT scans, MRI, Ultrasound, etc.  To afford each patient quality time with our providers, please arrive 30 minutes before your scheduled appointment time.  If you arrive late for your appointment, you may be asked to reschedule.  We strive to give you quality time with our providers, and arriving late affects you and other patients whose appointments are after yours. If you are a no show for multiple scheduled visits, you may be dismissed from the clinic at the providers discretion.     Resources: [facility] Social Workers [phone] for additional information on assistance programs or assistance connecting with community support programs   [place] DSS  [phone]: Information regarding food stamps, Medicaid, and utility assistance [place] Access [place] [phone]   [place] [employer]'s shared-ride transportation service for eligible riders who have a disability that prevents them from riding the fixed route bus.   Medicare  Rights Center 800-333-4114 Helps people with Medicare understand their rights and benefits, navigate the Medicare system, and secure the quality healthcare they deserve American [employer] 800-227-2345 Assists patients locate various types of support and financial assistance Cancer Care: 1-800-813-HOPE (4673) Provides financial assistance, online support groups, medication/co-pay assistance.   Transportation Assistance for appointments at [facility]: Transportation Coordinator [phone]  Again, thank you for choosing [facility] for your care.       

## 2019-12-14 NOTE — Progress Notes (Signed)
HEMATOLOGY/ONCOLOGY CONSULTATION NOTE  Date of Service: 12/14/2019  Patient Care Team: Bernerd Limbo, MD as PCP - General (Family Medicine)  CHIEF COMPLAINTS/PURPOSE OF CONSULTATION:  Weight loss & M-Spike  HISTORY OF PRESENTING ILLNESS:   David Decker is a wonderful 68 y.o. male who has been referred to [place] by Leafy Kindle, PA for evaluation and management of weight loss & M-Spike on SPEP. The pt reports that he is doing well overall.   The pt reports that in January he began having difficulty walking. They completed scans and it was discovered that he had DDD. Pt was given steroid injections, which helped his mobility for awhile. Some time after, his joints began to stiffen and become painful. Pt was taking Ibuprofen to manage his symptoms. His joint pain finally improved after he began Prednisone. Pt is following with Dr. Annamaria Boots for Rheumatology. They have not diagnosed the pt with Rheumatoid Arthritis, but are concerned that he has an autoimmune condition. Pt is at the end of a Prednisone taper and has been taking 1 mg for the last week. Pt lost about 30 lbs prior to starting Prednisone. He has since gained back 9 lbs. Dr. Annamaria Boots is considering starting pt on Methotrexate, but is waiting until pt completes his upcoming Colonoscopy.   Most recent lab results (11/15/2019) of CBC w/diff & CMP is as follows: all values are WNL except for Lymphs Rel at 16.8, Granulocytes Rel at 74.9, Granulocytes Abs at 8.2K, RBC at 4.38, Hgb at 12.9, HCT at 39.2, Glucose at 110, Creatinine at 0.70. 11/25/2019 SPEP is as follows: all values are WNL except for M-Spike at 0.3.  On review of systems, pt reports shoulder pain and denies joint pain, abdominal pain, constipation, diarrhea, leg swelling and any other symptoms.   On PMHx the pt reports Arthritis, HLD, HTN, Ischemic heart disease, CABG, DDD. On Family Hx the pt reports that his sister passed from Breast Cancer at the age of 48. No known autoimmune,  rheumatologic, or blood disorders in the family.   MEDICAL HISTORY:  Past Medical History:  Diagnosis Date  . Arthritis   . HLD (hyperlipidemia)   . HTN (hypertension)   . Ischemic heart disease   . Obesity     SURGICAL HISTORY: Past Surgical History:  Procedure Laterality Date  . CORONARY ARTERY BYPASS GRAFT  2003  . KNEE SURGERY      SOCIAL HISTORY: Social History   Socioeconomic History  . Marital status: Married    Spouse name: Not on file  . Number of children: 3  . Years of education: Not on file  . Highest education level: Not on file  Occupational History  . Occupation: Medical sales representative  Tobacco Use  . Smoking status: Current Some Day Smoker    Types: Cigars  . Smokeless tobacco: Never Used  Vaping Use  . Vaping Use: Never used  Substance and Sexual Activity  . Alcohol use: Yes    Comment: occasionally  . Drug use: Not on file  . Sexual activity: Not on file  Other Topics Concern  . Not on file  Social History Narrative  . Not on file   Social Determinants of Health   Financial Resource Strain:   . Difficulty of Paying Living Expenses:   Food Insecurity:   . Worried About Charity fundraiser in the Last Year:   . Arboriculturist in the Last Year:   Transportation Needs:   . Film/video editor (Medical):   Marland Kitchen  Lack of Transportation (Non-Medical):   Physical Activity:   . Days of Exercise per Week:   . Minutes of Exercise per Session:   Stress:   . Feeling of Stress :   Social Connections:   . Frequency of Communication with Friends and Family:   . Frequency of Social Gatherings with Friends and Family:   . Attends Religious Services:   . Active Member of Clubs or Organizations:   . Attends Archivist Meetings:   Marland Kitchen Marital Status:   Intimate Partner Violence:   . Fear of Current or Ex-Partner:   . Emotionally Abused:   Marland Kitchen Physically Abused:   . Sexually Abused:     FAMILY HISTORY: Family History  Problem Relation  Age of Onset  . Parkinsonism Other   . Heart attack Other     ALLERGIES:  is allergic to other and penicillins.  MEDICATIONS:  Current Outpatient Medications  Medication Sig Dispense Refill  . aspirin 81 MG EC tablet Take by mouth.    . cyclobenzaprine (FLEXERIL) 5 MG tablet Take by mouth.    Marland Kitchen ibuprofen (ADVIL) 200 MG tablet Take by mouth.    . rosuvastatin (CRESTOR) 20 MG tablet Take by mouth.     No current facility-administered medications for this visit.    REVIEW OF SYSTEMS:    10 Point review of Systems was done is negative except as noted above.  PHYSICAL EXAMINATION: ECOG PERFORMANCE STATUS: 0 - Asymptomatic  . Vitals:   12/14/19 1107  BP: 139/90  Pulse: 83  Resp: 18  Temp: 97.6 F (36.4 C)  SpO2: 99%   Filed Weights   12/14/19 1107  Weight: 189 lb 11.2 oz (86 kg)   .Body mass index is 25.73 kg/m.  GENERAL:alert, in no acute distress and comfortable SKIN: no acute rashes, no significant lesions EYES: conjunctiva are pink and non-injected, sclera anicteric OROPHARYNX: MMM, no exudates, no oropharyngeal erythema or ulceration NECK: supple, no JVD LYMPH:  no palpable lymphadenopathy in the cervical, axillary or inguinal regions LUNGS: clear to auscultation b/l with normal respiratory effort HEART: regular rate & rhythm ABDOMEN:  normoactive bowel sounds , non tender, not distended. Extremity: no pedal edema PSYCH: alert & oriented x 3 with fluent speech NEURO: no focal motor/sensory deficits  LABORATORY DATA:  I have reviewed the data as listed  . CBC Latest Ref Rng & Units 12/14/2019  WBC 4.0 - 10.5 K/uL 8.6  Hemoglobin 13.0 - 17.0 g/dL 15.2  Hematocrit 39 - 52 % 46.1  Platelets 150 - 400 K/uL 287    . CMP Latest Ref Rng & Units 12/14/2019  Glucose 70 - 99 mg/dL 147(H)  BUN 8 - 23 mg/dL 10  Creatinine 0.61 - 1.24 mg/dL 0.98  Sodium 135 - 145 mmol/L 140  Potassium 3.5 - 5.1 mmol/L 4.1  Chloride 98 - 111 mmol/L 103  CO2 22 - 32 mmol/L 28    Calcium 8.9 - 10.3 mg/dL 10.3  Total Protein 6.5 - 8.1 g/dL 7.7  Total Bilirubin 0.3 - 1.2 mg/dL 0.6  Alkaline Phos 38 - 126 U/L 55  AST 15 - 41 U/L 13(L)  ALT 0 - 44 U/L 19     RADIOGRAPHIC STUDIES: I have personally reviewed the radiological images as listed and agreed with the findings in the report. No results found.  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 -Discussed 11/25/2019 SPEP is as follows: all values are WNL except for M-Spike at 0.3. -Advised pt that his M-Spike was not very high and could be due to a reactive process caused by an autoimmune condition. -Advised pt that an elevated M-Spike carries concern for plasma cell dyscrasias, but that is less likely his his case.   -Advised pt that a complete w/o for Multiple Myeloma would include labs, UPEP, whole body bone scan and BM Bx.  -Would not recommend complete w/o based on current symptoms and labs. Could always complete if concern increases.  -Pt has previously had bone scans of head, chest, and hips that did not reveal any bone lesions.  -He had no hypercalcemia or unexplained renal dysfunction on last labs.  -Advised pt that once underlying autoimmune is treated MGUS should resolve.  -No contraindications to pt beginning Methotrexate from my standpoint.  -Will get labs today - will repeat labs in 6 months to observe trend.  -Recommend pt f/u for Coloscopy as scheduled. -Recommend pt f/u with Dr. Annamaria Boots  -Will see back in 2-3 weeks via phone   FOLLOW UP: Labs today Phone visit with Dr Irene Limbo in 2-3 weeks. Okay to schedule end of day if no other slots available.   All of the patients questions were answered with apparent  satisfaction. The patient knows to call the clinic with any problems, questions or concerns.  I spent 35 mins counseling the patient face to face. The total time spent in the appointment was 45 minutes and more than 50% was on counseling and direct patient cares.    Sullivan Lone MD [place] AAHIVMS [facility] Hematology/Oncology Physician [facility]  (Office):       [phone] (Work cell):  [phone] (Fax):           [phone]  12/14/2019 2:15 PM  I, Yevette Edwards, am acting as a scribe for Dr. Sullivan Lone.   .I have reviewed the above documentation for accuracy and completeness, and I agree with the above. Brunetta Genera MD

## 2019-12-15 LAB — KAPPA/LAMBDA LIGHT CHAINS
Kappa free light chain: 18.1 mg/L (ref 3.3–19.4)
Kappa, lambda light chain ratio: 0.96 (ref 0.26–1.65)
Lambda free light chains: 18.8 mg/L (ref 5.7–26.3)

## 2019-12-15 LAB — BETA 2 MICROGLOBULIN, SERUM: Beta-2 Microglobulin: 1.4 mg/L (ref 0.6–2.4)

## 2019-12-17 LAB — MULTIPLE MYELOMA PANEL, SERUM
Albumin SerPl Elph-Mcnc: 4.1 g/dL (ref 2.9–4.4)
Albumin/Glob SerPl: 1.5 (ref 0.7–1.7)
Alpha 1: 0.2 g/dL (ref 0.0–0.4)
Alpha2 Glob SerPl Elph-Mcnc: 1 g/dL (ref 0.4–1.0)
B-Globulin SerPl Elph-Mcnc: 0.9 g/dL (ref 0.7–1.3)
Gamma Glob SerPl Elph-Mcnc: 0.8 g/dL (ref 0.4–1.8)
Globulin, Total: 2.9 g/dL (ref 2.2–3.9)
IgA: 222 mg/dL (ref 61–437)
IgG (Immunoglobin G), Serum: 860 mg/dL (ref 603–1613)
IgM (Immunoglobulin M), Srm: 56 mg/dL (ref 20–172)
M Protein SerPl Elph-Mcnc: 0.2 g/dL — ABNORMAL HIGH
Total Protein ELP: 7 g/dL (ref 6.0–8.5)

## 2020-01-03 NOTE — Progress Notes (Signed)
HEMATOLOGY/ONCOLOGY CONSULTATION NOTE  Date of Service: 01/04/2020  Patient Care Team: Bernerd Limbo, MD as PCP - General (Family Medicine)  CHIEF COMPLAINTS/PURPOSE OF CONSULTATION:  Weight loss & M-Spike  HISTORY OF PRESENTING ILLNESS:   David Decker is a wonderful 69 y.o. male who has been referred to [place] by Leafy Kindle, PA for evaluation and management of weight loss & M-Spike on SPEP. The pt reports that he is doing well overall.   The pt reports that in January he began having difficulty walking. They completed scans and it was discovered that he had DDD. Pt was given steroid injections, which helped his mobility for awhile. Some time after, his joints began to stiffen and become painful. Pt was taking Ibuprofen to manage his symptoms. His joint pain finally improved after he began Prednisone. Pt is following with Dr. Annamaria Boots for Rheumatology. They have not diagnosed the pt with Rheumatoid Arthritis, but are concerned that he has an autoimmune condition. Pt is at the end of a Prednisone taper and has been taking 1 mg for the last week. Pt lost about 30 lbs prior to starting Prednisone. He has since gained back 9 lbs. Dr. Annamaria Boots is considering starting pt on Methotrexate, but is waiting until pt completes his upcoming Colonoscopy.   Most recent lab results (11/15/2019) of CBC w/diff & CMP is as follows: all values are WNL except for Lymphs Rel at 16.8, Granulocytes Rel at 74.9, Granulocytes Abs at 8.2K, RBC at 4.38, Hgb at 12.9, HCT at 39.2, Glucose at 110, Creatinine at 0.70. 11/25/2019 SPEP is as follows: all values are WNL except for M-Spike at 0.3.  On review of systems, pt reports shoulder pain and denies joint pain, abdominal pain, constipation, diarrhea, leg swelling and any other symptoms.   On PMHx the pt reports Arthritis, HLD, HTN, Ischemic heart disease, CABG, DDD. On Family Hx the pt reports that his sister passed from Breast Cancer at the age of 79. No known autoimmune,  rheumatologic, or blood disorders in the family.   INTERVAL HISTORY:  I connected with  David Decker on 01/04/20 by telephone and verified that I am speaking with the correct person using two identifiers.   I discussed the limitations of evaluation and management by telemedicine. The patient expressed understanding and agreed to proceed.  Other persons participating in the visit and their role in the encounter:       -Yevette Edwards, Medical Scribe  Patient's location: Home Provider's location: [place] at [employer] is a wonderful 69 y.o. male who is here for evaluation and management of weight loss & M-Spike on SPEP. The patient's last visit with [place] was on 12/14/2019. The pt reports that he is doing well overall.  The pt reports that he had a Colonoscopy today. His Gastroenterologist found several polyps and pus. He is being prescribed antibiotics.   Lab results (12/14/19) of CBC w/diff and CMP is as follows: all values are WNL except for Abs Immature Granulocytes at 0.12K, Glucose at 147, AST at 13. 12/14/2019 LDH at 174 12/14/2019 Sed Rate at 53 12/14/2019 Beta-2 Microglobulin at 1.4 12/14/2019 K/L light chains show all values are WNL 12/14/2019 MMP is as follows: all values are WNL except for M Protein at 0.2  On review of systems, pt denies any other symptoms.   MEDICAL HISTORY:  Past Medical History:  Diagnosis Date  . Arthritis   . HLD (hyperlipidemia)   . HTN (hypertension)   . Ischemic heart disease   .  Obesity     SURGICAL HISTORY: Past Surgical History:  Procedure Laterality Date  . CORONARY ARTERY BYPASS GRAFT  2003  . KNEE SURGERY      SOCIAL HISTORY: Social History   Socioeconomic History  . Marital status: Married    Spouse name: Not on file  . Number of children: 3  . Years of education: Not on file  . Highest education level: Not on file  Occupational History  . Occupation: Medical sales representative  Tobacco Use  . Smoking  status: Current Some Day Smoker    Types: Cigars  . Smokeless tobacco: Never Used  Vaping Use  . Vaping Use: Never used  Substance and Sexual Activity  . Alcohol use: Yes    Comment: occasionally  . Drug use: Not on file  . Sexual activity: Not on file  Other Topics Concern  . Not on file  Social History Narrative  . Not on file   Social Determinants of Health   Financial Resource Strain:   . Difficulty of Paying Living Expenses: Not on file  Food Insecurity:   . Worried About Charity fundraiser in the Last Year: Not on file  . Ran Out of Food in the Last Year: Not on file  Transportation Needs:   . Lack of Transportation (Medical): Not on file  . Lack of Transportation (Non-Medical): Not on file  Physical Activity:   . Days of Exercise per Week: Not on file  . Minutes of Exercise per Session: Not on file  Stress:   . Feeling of Stress : Not on file  Social Connections:   . Frequency of Communication with Friends and Family: Not on file  . Frequency of Social Gatherings with Friends and Family: Not on file  . Attends Religious Services: Not on file  . Active Member of Clubs or Organizations: Not on file  . Attends Archivist Meetings: Not on file  . Marital Status: Not on file  Intimate Partner Violence:   . Fear of Current or Ex-Partner: Not on file  . Emotionally Abused: Not on file  . Physically Abused: Not on file  . Sexually Abused: Not on file    FAMILY HISTORY: Family History  Problem Relation Age of Onset  . Parkinsonism Other   . Heart attack Other     ALLERGIES:  is allergic to other and penicillins.  MEDICATIONS:  Current Outpatient Medications  Medication Sig Dispense Refill  . aspirin 81 MG EC tablet Take by mouth.    . clobetasol (TEMOVATE) 0.05 % external solution Apply topically 2 (two) times daily.    . cyclobenzaprine (FLEXERIL) 5 MG tablet Take by mouth. (Patient not taking: Reported on 12/14/2019)    . gabapentin (NEURONTIN) 300  MG capsule Take by mouth.    Marland Kitchen ibuprofen (ADVIL) 200 MG tablet Take by mouth.    . meloxicam (MOBIC) 15 MG tablet Take 15 mg by mouth daily.    Marland Kitchen oxyCODONE-acetaminophen (PERCOCET/ROXICET) 5-325 MG tablet Take 1 tablet by mouth every 6 (six) hours as needed.    . predniSONE (DELTASONE) 10 MG tablet Take 20 mg by mouth daily.    . predniSONE (DELTASONE) 5 MG tablet TAKE SIX TABLETS ONCE DAILY for 2 days, 5 for 2 days, 4 for 2 days, 3 for 2 days, 2 for 2 days 1 for 2 days (Patient not taking: Reported on 12/14/2019)    . rosuvastatin (CRESTOR) 20 MG tablet Take by mouth.    . traZODone (  DESYREL) 50 MG tablet take ONE-HALF TO ONE TABLET BY MOUTH EVERYDAY AT BEDTIME TO help SLEEP     No current facility-administered medications for this visit.    REVIEW OF SYSTEMS:   A 10+ POINT REVIEW OF SYSTEMS WAS OBTAINED including neurology, dermatology, psychiatry, cardiac, respiratory, lymph, extremities, GI, GU, Musculoskeletal, constitutional, breasts, reproductive, HEENT.  All pertinent positives are noted in the HPI.  All others are negative.   PHYSICAL EXAMINATION: ECOG PERFORMANCE STATUS: 0 - Asymptomatic  Telehealth visit   LABORATORY DATA:  I have reviewed the data as listed  . CBC Latest Ref Rng & Units 12/14/2019  WBC 4.0 - 10.5 K/uL 8.6  Hemoglobin 13.0 - 17.0 g/dL 15.2  Hematocrit 39 - 52 % 46.1  Platelets 150 - 400 K/uL 287    . CMP Latest Ref Rng & Units 12/14/2019  Glucose 70 - 99 mg/dL 147(H)  BUN 8 - 23 mg/dL 10  Creatinine 0.61 - 1.24 mg/dL 0.98  Sodium 135 - 145 mmol/L 140  Potassium 3.5 - 5.1 mmol/L 4.1  Chloride 98 - 111 mmol/L 103  CO2 22 - 32 mmol/L 28  Calcium 8.9 - 10.3 mg/dL 10.3  Total Protein 6.5 - 8.1 g/dL 7.7  Total Bilirubin 0.3 - 1.2 mg/dL 0.6  Alkaline Phos 38 - 126 U/L 55  AST 15 - 41 U/L 13(L)  ALT 0 - 44 U/L 19     RADIOGRAPHIC STUDIES: I have personally reviewed the radiological images as listed and agreed with the findings in the report. No  results found.  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  -Advised pt that treating his underlying autoimmune condition should improve his MGUS.  -Advised pt that there is a 1% chance of MGUS transformation into Multiple Myeloma every year. -Recommend pt check labwork in 6 months, and then annually with Dr. Coletta Memos.    -Will see back as needed   FOLLOW UP: RTC with Dr Irene Limbo as needed   The total time spent in the appt was 20 minutes and more than 50% was on counseling and direct patient cares.  All of the patient's questions were answered with apparent satisfaction. The patient knows to call the clinic with any problems, questions or concerns.    Sullivan Lone MD [place] AAHIVMS [facility] Hematology/Oncology Physician [facility]  (Office):       [phone] (Work cell):  [phone] (Fax):           [phone]  01/04/2020 4:55 PM  I, Yevette Edwards, am acting as a scribe for Dr. Sullivan Lone.   .I have reviewed the above documentation for accuracy and completeness, and I agree with the above. Brunetta Genera MD

## 2020-01-04 ENCOUNTER — Inpatient Hospital Stay: Payer: Medicare HMO | Attending: Hematology | Admitting: Hematology

## 2020-01-04 DIAGNOSIS — K635 Polyp of colon: Secondary | ICD-10-CM | POA: Diagnosis not present

## 2020-01-04 DIAGNOSIS — R933 Abnormal findings on diagnostic imaging of other parts of digestive tract: Secondary | ICD-10-CM | POA: Diagnosis not present

## 2020-01-04 DIAGNOSIS — K5730 Diverticulosis of large intestine without perforation or abscess without bleeding: Secondary | ICD-10-CM | POA: Diagnosis not present

## 2020-01-04 DIAGNOSIS — D472 Monoclonal gammopathy: Secondary | ICD-10-CM

## 2020-01-04 DIAGNOSIS — D122 Benign neoplasm of ascending colon: Secondary | ICD-10-CM | POA: Diagnosis not present

## 2020-01-04 DIAGNOSIS — D124 Benign neoplasm of descending colon: Secondary | ICD-10-CM | POA: Diagnosis not present

## 2020-01-04 DIAGNOSIS — K523 Indeterminate colitis: Secondary | ICD-10-CM | POA: Diagnosis not present

## 2020-01-12 DIAGNOSIS — R69 Illness, unspecified: Secondary | ICD-10-CM | POA: Diagnosis not present

## 2020-01-31 DIAGNOSIS — M2550 Pain in unspecified joint: Secondary | ICD-10-CM | POA: Diagnosis not present

## 2020-01-31 DIAGNOSIS — M0609 Rheumatoid arthritis without rheumatoid factor, multiple sites: Secondary | ICD-10-CM | POA: Diagnosis not present

## 2020-01-31 DIAGNOSIS — Z6823 Body mass index (BMI) 23.0-23.9, adult: Secondary | ICD-10-CM | POA: Diagnosis not present

## 2020-02-16 DIAGNOSIS — R69 Illness, unspecified: Secondary | ICD-10-CM | POA: Diagnosis not present

## 2020-03-06 DIAGNOSIS — R69 Illness, unspecified: Secondary | ICD-10-CM | POA: Diagnosis not present

## 2020-03-20 DIAGNOSIS — Z6824 Body mass index (BMI) 24.0-24.9, adult: Secondary | ICD-10-CM | POA: Diagnosis not present

## 2020-03-20 DIAGNOSIS — M2550 Pain in unspecified joint: Secondary | ICD-10-CM | POA: Diagnosis not present

## 2020-03-20 DIAGNOSIS — M0609 Rheumatoid arthritis without rheumatoid factor, multiple sites: Secondary | ICD-10-CM | POA: Diagnosis not present

## 2020-10-27 IMAGING — US US ABDOMINAL AORTA SCREENING AAA
1 series · 12 of 12 positions shown · non-contrast
Comparison: None.

CLINICAL DATA: hypertension, history of coronary artery disease,
hyperlipidemia

EXAM:
US ABDOMINAL AORTA MEDICARE SCREENING
TECHNIQUE: Ultrasound examination of the abdominal aorta was performed as a
screening evaluation for abdominal aortic aneurysm.

[Series 1: us abdominal aorta screening aaa · 0.29mm/px · 12 of 12 slices shown]
[im 1/12]
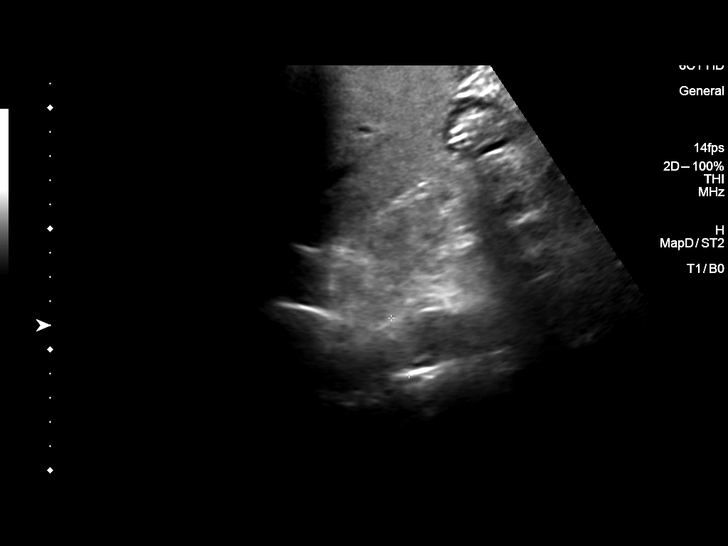
[im 2/12]
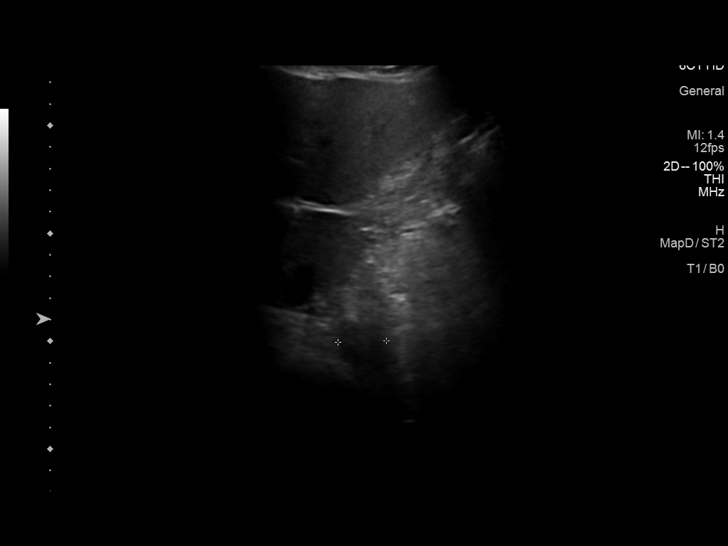
[im 3/12]
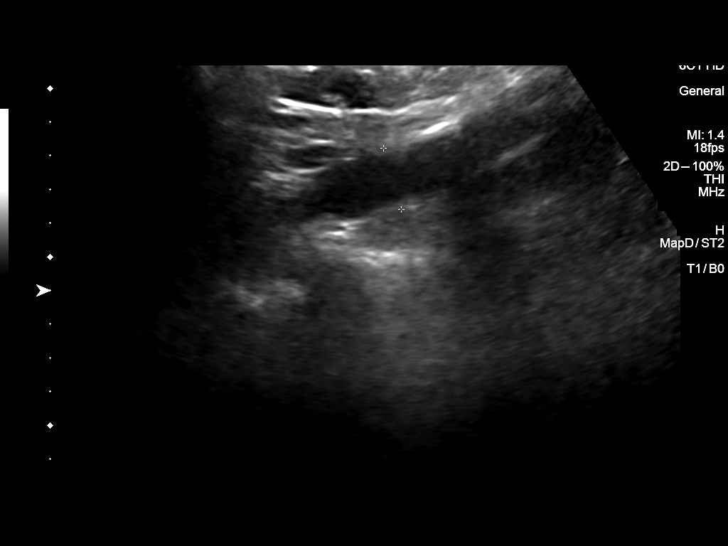
[im 4/12]
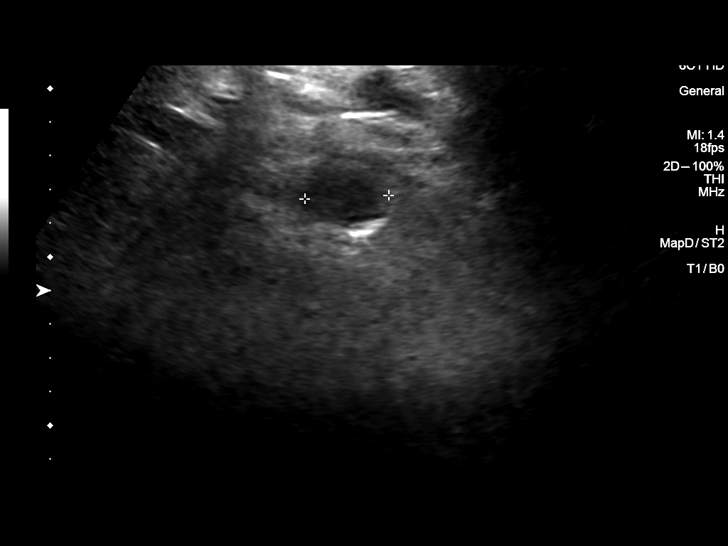
[im 5/12]
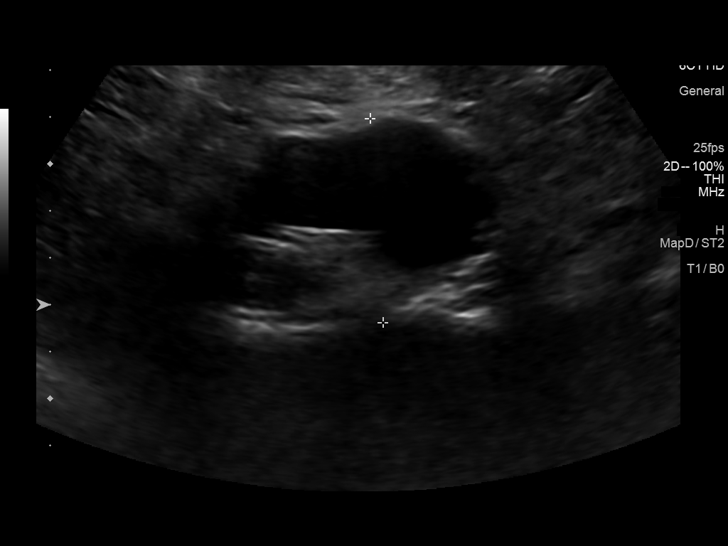
[im 6/12]
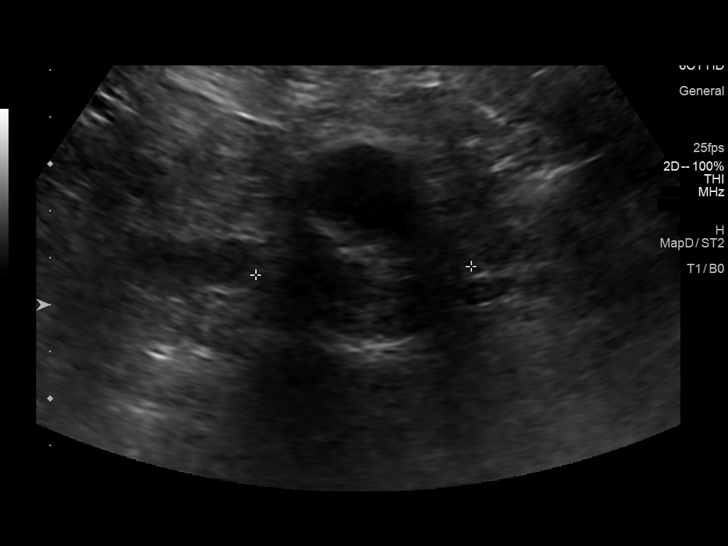
[im 7/12]
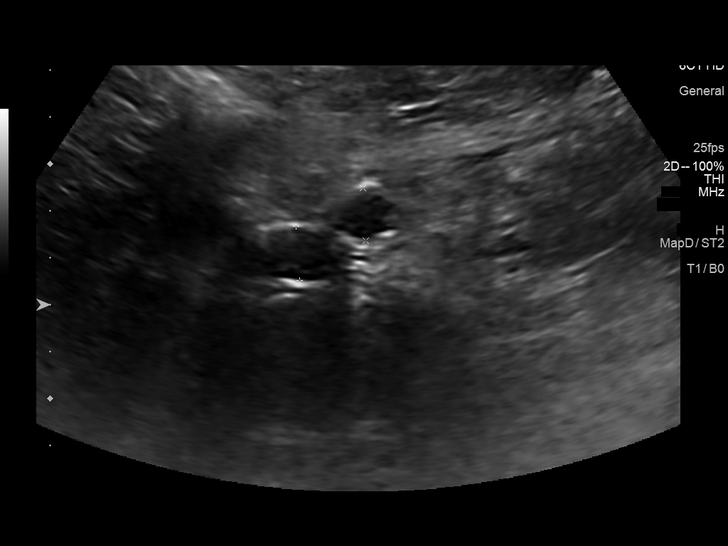
[im 8/12]
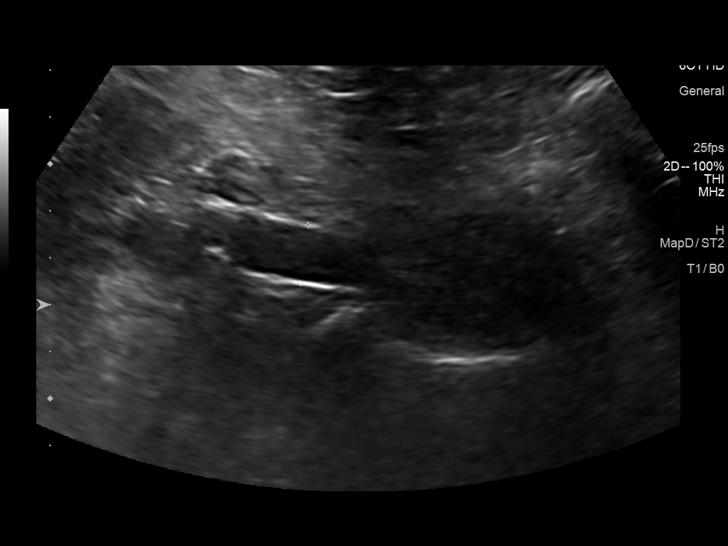
[im 9/12]
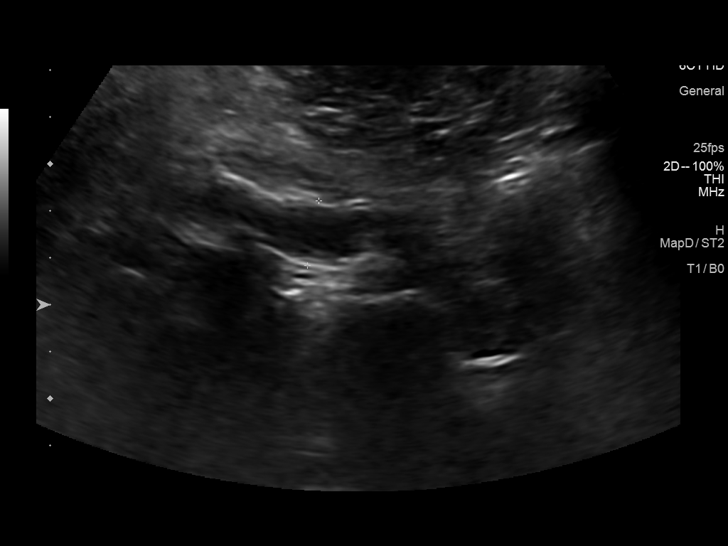
[im 10/12]
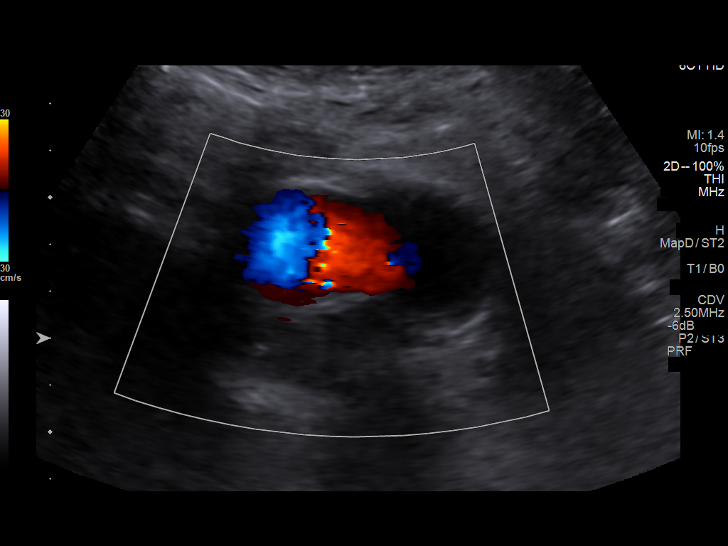
[im 11/12]
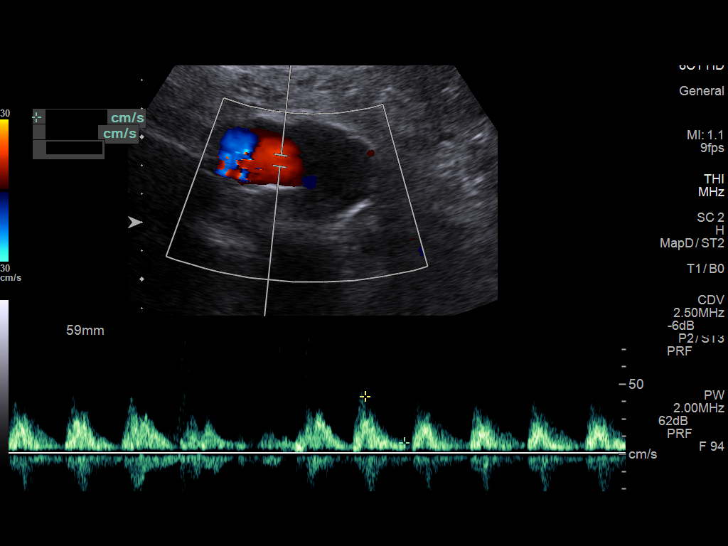
[im 12/12]
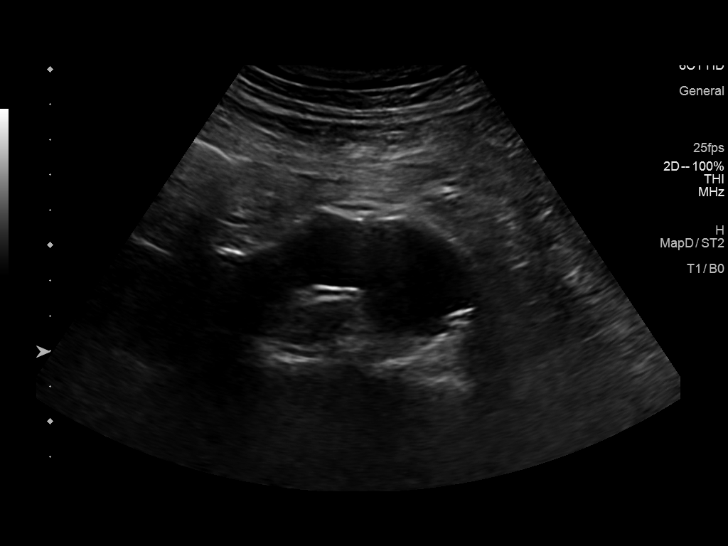

[12 of 12 positions shown; findings below may reference images not displayed]

FINDINGS: Abdominal aortic measurements as follows:

Proximal:  2.5 x 2.2 cm

Mid:  1.9 x 2.5 cm

Distal:  4.4 x 4.6 cm

Bilateral common iliac arteries measure approximately 13 mm on the
right and 14 mm on the left in maximal dimension in the sagittal
plane.

Color-flow imaging demonstrates significant mural thrombus within
the aneurysmal dilatation of the distal abdominal aorta.
IMPRESSION: 1. 4.6 cm infrarenal abdominal aortic aneurysm with prominent mural
thrombus.

## 2021-01-16 DIAGNOSIS — I714 Abdominal aortic aneurysm, without rupture, unspecified: Secondary | ICD-10-CM | POA: Insufficient documentation

## 2022-09-25 ENCOUNTER — Encounter: Payer: Self-pay | Admitting: Vascular Surgery

## 2022-09-25 ENCOUNTER — Ambulatory Visit: Payer: Medicare HMO | Admitting: Vascular Surgery

## 2022-09-25 VITALS — BP 121/78 | HR 76 | Temp 97.8°F | Resp 20 | Ht 72.0 in | Wt 186.9 lb

## 2022-09-25 DIAGNOSIS — I7143 Infrarenal abdominal aortic aneurysm, without rupture: Secondary | ICD-10-CM

## 2022-09-25 NOTE — Progress Notes (Signed)
Patient ID: David Decker, male   DOB: [birth year], 72 y.o.   MRN: [id number]  Reason for Consult: New Patient (Initial Visit)   Referred by Tracey Harries, MD  Subjective:     HPI:  David Decker is a 72 y.o. male has a history of abdominal aortic aneurysm initially evaluated in 2021 more recently evaluated at [facility].  He has no new back or abdominal pain.  He does smoke cigars when he plays golf.  He has hypertension hyperlipidemia.  He had coronary bypass graft 20 years ago no recent heart issues.  Does not take any blood.  Past Medical History:  Diagnosis Date   Arthritis    HLD (hyperlipidemia)    HTN (hypertension)    Ischemic heart disease    Obesity    Family History  Problem Relation Age of Onset   Parkinsonism Other    Heart attack Other    Past Surgical History:  Procedure Laterality Date   CORONARY ARTERY BYPASS GRAFT  2003   KNEE SURGERY      Short Social History:  Social History   Tobacco Use   Smoking status: Some Days    Types: Cigars   Smokeless tobacco: Never  Substance Use Topics   Alcohol use: Yes    Comment: occasionally    Allergies  Allergen Reactions   Other Hives    walnuts   Penicillins     Current Outpatient Medications  Medication Sig Dispense Refill   aspirin 81 MG EC tablet Take by mouth.     folic acid (FOLVITE) 1 MG tablet Take 1 mg by mouth daily.     ibuprofen (ADVIL) 200 MG tablet Take by mouth.     methotrexate (RHEUMATREX) 2.5 MG tablet Take 20 mg by mouth once a week.     rosuvastatin (CRESTOR) 20 MG tablet Take by mouth.     No current facility-administered medications for this visit.    Review of Systems  Constitutional:  Constitutional negative. HENT: HENT negative.  Eyes: Eyes negative.  Respiratory: Respiratory negative.  Cardiovascular: Cardiovascular negative.  GI: Gastrointestinal negative.  Musculoskeletal: Musculoskeletal negative.  Skin: Skin negative.  Neurological: Neurological  negative. Hematologic: Hematologic/lymphatic negative.  Psychiatric: Psychiatric negative.        Objective:  Objective   Vitals:   09/25/22 1007  BP: 121/78  Pulse: 76  Resp: 20  Temp: 97.8 F (36.6 C)  SpO2: 97%  Weight: 186 lb 14.4 oz (84.8 kg)  Height: 6' (1.829 m)   Body mass index is 25.35 kg/m.  Physical Exam HENT:     Head: Normocephalic.     Nose: Nose normal.  Eyes:     Pupils: Pupils are equal, round, and reactive to light.  Cardiovascular:     Pulses:          Popliteal pulses are 3+ on the right side and 3+ on the left side.  Pulmonary:     Effort: Pulmonary effort is normal.  Abdominal:     General: Abdomen is flat.     Palpations: Abdomen is soft.     Comments: Palpable aortic aneurysm  Musculoskeletal:        General: Normal range of motion.     Right lower leg: No edema.     Left lower leg: No edema.  Skin:    General: Skin is warm.     Capillary Refill: Capillary refill takes less than 2 seconds.  Neurological:     General: No focal deficit present.  Mental Status: He is alert.  Psychiatric:        Mood and Affect: Mood normal.        Behavior: Behavior normal.        Thought Content: Thought content normal.        Judgment: Judgment normal.     Data: TECHNIQUE: Abdomen aorta ultrasound. Comparison study 01/31/2022.   HISTORY: AAA. Smoking history.   FINDINGS: Abdominal aorta and common iliac arteries patent. No significant plaque. Maximum aortic diameter 4.5 cm, previously measured 4.3 cm. Stable small bilateral renal cysts.    IMPRESSION: 4.5 cm AAA.      Assessment/Plan:    72 year old male with a 4.5 cm abdominal aortic aneurysm initially thought to be 4.6 cm in 2021 last October which measured at 4.3 cm.  He also has very strong palpable popliteal artery pulses today.  As such I will have him follow-up in 6 months with abdominal aortic aneurysm duplex and popliteal artery aneurysm duplexes as well.  We discussed the signs  and symptoms of aneurysm rupture as well as thrombosis of bilateral lower extremity she demonstrates good understanding and short of this we will see him in 6 months and he is okay for activity at his current level.     Maeola Harman MD [facility]

## 2022-10-01 ENCOUNTER — Other Ambulatory Visit: Payer: Self-pay

## 2022-10-01 DIAGNOSIS — I7143 Infrarenal abdominal aortic aneurysm, without rupture: Secondary | ICD-10-CM

## 2022-10-01 DIAGNOSIS — I724 Aneurysm of artery of lower extremity: Secondary | ICD-10-CM

## 2023-03-26 ENCOUNTER — Encounter (HOSPITAL_COMMUNITY): Payer: Medicare HMO

## 2023-03-26 ENCOUNTER — Ambulatory Visit: Payer: Medicare HMO | Admitting: Vascular Surgery

## 2023-03-26 ENCOUNTER — Other Ambulatory Visit (HOSPITAL_COMMUNITY): Payer: Medicare HMO

## 2023-04-02 ENCOUNTER — Ambulatory Visit (INDEPENDENT_AMBULATORY_CARE_PROVIDER_SITE_OTHER)
Admission: RE | Admit: 2023-04-02 | Discharge: 2023-04-02 | Disposition: A | Discharge status: Home / Self Care | Payer: Medicare HMO | Source: Ambulatory Visit | Attending: Vascular Surgery

## 2023-04-02 ENCOUNTER — Ambulatory Visit (INDEPENDENT_AMBULATORY_CARE_PROVIDER_SITE_OTHER)
Admission: RE | Admit: 2023-04-02 | Discharge: 2023-04-02 | Disposition: A | Discharge status: Home / Self Care | Payer: Medicare HMO | Source: Ambulatory Visit | Attending: Vascular Surgery | Admitting: Vascular Surgery

## 2023-04-02 ENCOUNTER — Ambulatory Visit: Payer: Medicare HMO | Admitting: Vascular Surgery

## 2023-04-02 ENCOUNTER — Ambulatory Visit (HOSPITAL_COMMUNITY)
Admission: RE | Admit: 2023-04-02 | Discharge: 2023-04-02 | Disposition: A | Discharge status: Home / Self Care | Payer: Medicare HMO | Source: Ambulatory Visit | Attending: Vascular Surgery | Admitting: Vascular Surgery

## 2023-04-02 ENCOUNTER — Encounter: Payer: Self-pay | Admitting: Vascular Surgery

## 2023-04-02 VITALS — BP 126/85 | HR 81 | Temp 98.0°F | Resp 20 | Ht 72.0 in | Wt 186.5 lb

## 2023-04-02 DIAGNOSIS — I724 Aneurysm of artery of lower extremity: Secondary | ICD-10-CM | POA: Insufficient documentation

## 2023-04-02 DIAGNOSIS — I7143 Infrarenal abdominal aortic aneurysm, without rupture: Secondary | ICD-10-CM | POA: Diagnosis not present

## 2023-04-02 LAB — VAS US ABI WITH/WO TBI
Left ABI: 1.19
Right ABI: 1.11

## 2023-04-02 NOTE — Progress Notes (Signed)
Patient ID: David Decker, male   DOB: 05/13/1950, 72 y.o.   MRN: [id number]  Reason for Consult: Follow-up   Referred by Tracey Harries, MD  Subjective:     HPI:  David Decker is a 72 y.o. male history of abdominal aortic aneurysm followed up last year noted to be 4.5 cm.  Patient continues to golf frequently does not have any new abdominal or back pain.  He does smoke cigars he is not a cigarette smoker.  At the last visit he had strongly palpable popliteal pulses and is now here for evaluation of abdominal aortic aneurysm as well as bilateral popliteal artery duplex to rule out aneurysm disease.  He has no new complaints.  Past Medical History:  Diagnosis Date   Arthritis    HLD (hyperlipidemia)    HTN (hypertension)    Ischemic heart disease    Obesity    Family History  Problem Relation Age of Onset   Parkinsonism Other    Heart attack Other    Past Surgical History:  Procedure Laterality Date   CORONARY ARTERY BYPASS GRAFT  2003   KNEE SURGERY      Short Social History:  Social History   Tobacco Use   Smoking status: Some Days    Types: Cigars   Smokeless tobacco: Never  Substance Use Topics   Alcohol use: Yes    Comment: occasionally    Allergies  Allergen Reactions   Other Hives    walnuts   Penicillins     Current Outpatient Medications  Medication Sig Dispense Refill   aspirin 81 MG EC tablet Take by mouth.     folic acid (FOLVITE) 1 MG tablet Take 1 mg by mouth daily.     methotrexate (RHEUMATREX) 2.5 MG tablet Take 20 mg by mouth once a week.     rosuvastatin (CRESTOR) 20 MG tablet Take by mouth.     No current facility-administered medications for this visit.    Review of Systems  Constitutional:  Constitutional negative. HENT: HENT negative.  Eyes: Eyes negative.  Respiratory: Respiratory negative.  Cardiovascular: Cardiovascular negative.  GI: Gastrointestinal negative.  Musculoskeletal: Musculoskeletal negative.  Skin: Skin  negative.  Neurological: Neurological negative. Hematologic: Hematologic/lymphatic negative.  Psychiatric: Psychiatric negative.        Objective:  Objective   Vitals:   04/02/23 0946  BP: 126/85  Pulse: 81  Resp: 20  Temp: 98 F (36.7 C)  SpO2: 95%  Weight: 186 lb 8 oz (84.6 kg)  Height: 6' (1.829 m)   Body mass index is 25.29 kg/m.  Physical Exam HENT:     Head: Normocephalic.     Nose: Nose normal.  Eyes:     Pupils: Pupils are equal, round, and reactive to light.  Cardiovascular:     Rate and Rhythm: Normal rate.     Pulses:          Popliteal pulses are 3+ on the right side and 3+ on the left side.  Pulmonary:     Effort: Pulmonary effort is normal.  Abdominal:     Palpations: Abdomen is soft. There is no mass.  Musculoskeletal:     Cervical back: Neck supple.  Skin:    Capillary Refill: Capillary refill takes less than 2 seconds.  Neurological:     General: No focal deficit present.     Mental Status: He is alert.  Psychiatric:        Mood and Affect: Mood normal.  Thought Content: Thought content normal.        Judgment: Judgment normal.     Data: Abdominal Aorta Findings:  +-----------+-------+----------+----------+--------+--------+--------+  Location  AP (cm)Trans (cm)PSV (cm/s)WaveformThrombusComments  +-----------+-------+----------+----------+--------+--------+--------+  Proximal  3.11   3.29      80                                  +-----------+-------+----------+----------+--------+--------+--------+  Mid       4.53   4.46      77                                  +-----------+-------+----------+----------+--------+--------+--------+  Distal    3.02   3.83      93                                  +-----------+-------+----------+----------+--------+--------+--------+  RT CIA Prox1.0    1.0       177                                  +-----------+-------+----------+----------+--------+--------+--------+  LT CIA Prox1.0    0.9       189                                 +-----------+-------+----------+----------+--------+--------+--------+     Summary:  Abdominal Aorta: There is evidence of abnormal dilatation of the mid  Abdominal aorta. The largest aortic measurement is 4.5 cm. The largest  aortic diameter remains essentially unchanged compared to prior exam.  Previous diameter measurement was 4.5 cm  obtained on 08/23/22.    ---------------+-------+-----------+---------+--------+-----+--------+  Right PoplitealAP (cm)Transv (cm)Waveform StenosisShapeComments  +---------------+-------+-----------+---------+--------+-----+--------+  Proximal      0.73   0.88       triphasic                       +---------------+-------+-----------+---------+--------+-----+--------+  Mid           0.61   0.59       triphasic                       +---------------+-------+-----------+---------+--------+-----+--------+  Distal        0.62   0.68       triphasic                       +---------------+-------+-----------+---------+--------+-----+--------+   +--------------+-------+-----------+---------+--------+-----+--------+  Left PoplitealAP (cm)Transv (cm)Waveform StenosisShapeComments  +--------------+-------+-----------+---------+--------+-----+--------+  Proximal     0.73   0.99       triphasic                       +--------------+-------+-----------+---------+--------+-----+--------+  Mid          0.82   0.89       triphasic                       +--------------+-------+-----------+---------+--------+-----+--------+  Distal       0.56   0.70       triphasic                       +--------------+-------+-----------+---------+--------+-----+--------+  Summary:  Right: No evidence of popliteal artery aneurysm   Left: No evidence of popliteal artery  aneurysm.          Assessment/Plan:    72 year old male with 4.5 cm abdominal aortic aneurysm which is stable in size.  Plan for follow-up in 6 months.  Popliteal arteries continued to be 3+ palpable however are normal in size and do not need further evaluation.  Signs and symptoms of rupture of abdominal aortic aneurysm discussed with the patient he demonstrates good understanding and sure that we will follow-up as above.     Maeola Harman MD [facility]

## 2023-04-04 ENCOUNTER — Other Ambulatory Visit: Payer: Self-pay

## 2023-04-04 DIAGNOSIS — I7143 Infrarenal abdominal aortic aneurysm, without rupture: Secondary | ICD-10-CM

## 2023-10-01 ENCOUNTER — Encounter: Payer: Self-pay | Admitting: Vascular Surgery

## 2023-10-01 ENCOUNTER — Ambulatory Visit (HOSPITAL_COMMUNITY)
Admission: RE | Admit: 2023-10-01 | Discharge: 2023-10-01 | Disposition: A | Discharge status: Home / Self Care | Payer: Medicare HMO | Source: Ambulatory Visit | Attending: Vascular Surgery | Admitting: Vascular Surgery

## 2023-10-01 ENCOUNTER — Ambulatory Visit: Payer: Medicare HMO | Attending: Vascular Surgery | Admitting: Vascular Surgery

## 2023-10-01 VITALS — BP 120/78 | HR 64 | Temp 97.8°F | Ht 72.0 in | Wt 182.9 lb

## 2023-10-01 DIAGNOSIS — I7143 Infrarenal abdominal aortic aneurysm, without rupture: Secondary | ICD-10-CM | POA: Diagnosis present

## 2023-10-01 NOTE — Progress Notes (Signed)
 Patient ID: David Decker, male   DOB: 09-Feb-1951, 73 y.o.   MRN: [id number]  Reason for Consult: Follow-up   Referred by Alfredia Ina, MD  Subjective:     HPI:  Zabian Swayne is a 73 y.o. male with history of known abdominal aneurysm that has been followed for many years.  He continues to golf frequently recently returned from a trip in [place] .  He continues to smoke occasional cigars but does not smoke cigarettes.  He has previously been evaluated popliteal artery duplexes which revealed no aneurysms.  He really has no complaints related to today's visit.  Past Medical History:  Diagnosis Date   AAA (abdominal aortic aneurysm) (HCC)    Arthritis    HLD (hyperlipidemia)    HTN (hypertension)    Ischemic heart disease    Obesity    Family History  Problem Relation Age of Onset   Parkinsonism Other    Heart attack Other    Past Surgical History:  Procedure Laterality Date   CORONARY ARTERY BYPASS GRAFT  2003   KNEE SURGERY      Short Social History:  Social History   Tobacco Use   Smoking status: Some Days    Types: Cigars   Smokeless tobacco: Never  Substance Use Topics   Alcohol use: Yes    Comment: occasionally    Allergies  Allergen Reactions   Other Hives    walnuts   Penicillins     Current Outpatient Medications  Medication Sig Dispense Refill   aspirin 81 MG EC tablet Take by mouth.     folic acid (FOLVITE) 1 MG tablet Take 1 mg by mouth daily.     methotrexate (RHEUMATREX) 2.5 MG tablet Take 20 mg by mouth once a week.     rosuvastatin (CRESTOR) 20 MG tablet Take by mouth.     No current facility-administered medications for this visit.    Review of Systems  Constitutional:  Constitutional negative. HENT: HENT negative.  Eyes: Eyes negative.  Respiratory: Respiratory negative.  Cardiovascular: Cardiovascular negative.  GI: Gastrointestinal negative.  Musculoskeletal: Musculoskeletal negative.  Skin: Skin negative.   Neurological: Neurological negative. Hematologic: Hematologic/lymphatic negative.  Psychiatric: Psychiatric negative.        Objective:  Objective   Vitals:   10/01/23 0923  BP: 120/78  Pulse: 64  Temp: 97.8 F (36.6 C)  SpO2: 97%  Weight: 182 lb 14.4 oz (83 kg)  Height: 6' (1.829 m)   Body mass index is 24.81 kg/m.  Physical Exam HENT:     Head: Normocephalic.     Nose: Nose normal.  Eyes:     Pupils: Pupils are equal, round, and reactive to light.  Cardiovascular:     Rate and Rhythm: Normal rate.     Pulses:          Popliteal pulses are 3+ on the right side and 3+ on the left side.  Pulmonary:     Effort: Pulmonary effort is normal.  Abdominal:     General: Abdomen is flat.     Palpations: Abdomen is soft. There is mass.  Musculoskeletal:     Cervical back: Normal range of motion.  Skin:    General: Skin is warm.     Capillary Refill: Capillary refill takes less than 2 seconds.  Neurological:     General: No focal deficit present.     Mental Status: He is alert.  Psychiatric:        Mood and Affect:  Mood normal.        Thought Content: Thought content normal.        Judgment: Judgment normal.     Data: Abdominal Aorta Findings:  +-----------+-------+----------+----------+--------+--------+-----------+  Location  AP (cm)Trans (cm)PSV (cm/s)WaveformThrombusComments     +-----------+-------+----------+----------+--------+--------+-----------+  Proximal  1.95   2.46      109                       Supraceliac  +-----------+-------+----------+----------+--------+--------+-----------+  Mid       6.05   5.06      62                                     +-----------+-------+----------+----------+--------+--------+-----------+  Distal    3.35   3.53      130                                    +-----------+-------+----------+----------+--------+--------+-----------+  RT CIA Prox1.8    1.9       145                                     +-----------+-------+----------+----------+--------+--------+-----------+  LT CIA Prox1.1    1.6       139                                    +-----------+-------+----------+----------+--------+--------+-----------+   Proximal aorta measurement from prior study was the infraceliac aorta;  today's proximal measurement is of the supraceliac aorta.      Summary:  Abdominal Aorta: There is evidence of abnormal dilatation of the mid and  distal Abdominal aorta. The largest aortic measurement is 6.0 cm. The  largest aortic diameter has significantly increased compared to prior  exam. Previous diameter measurement was  4.5 cm obtained on 04/02/2023.      Assessment/Plan:    74 year old male with known abdominal aortic aneurysm which is now increased in size to 6 cm in the infrarenal position.  As such we will plan for CT angio for further evaluation.  We discussed the signs and symptoms of rupture he demonstrates good understanding.  He does have very strongly palpable popliteal pulses but these have been evaluated in the past and were noted to be normal size.  We will obtain CTA and follow-up in the next couple weeks to discuss surgical intervention.     Adine Hoof MD [facility]

## 2023-10-06 ENCOUNTER — Other Ambulatory Visit: Payer: Self-pay

## 2023-10-06 DIAGNOSIS — I7143 Infrarenal abdominal aortic aneurysm, without rupture: Secondary | ICD-10-CM

## 2023-10-20 ENCOUNTER — Ambulatory Visit
Admission: RE | Admit: 2023-10-20 | Discharge: 2023-10-20 | Disposition: A | Discharge status: Home / Self Care | Source: Ambulatory Visit | Attending: Vascular Surgery | Admitting: Vascular Surgery

## 2023-10-20 DIAGNOSIS — I7143 Infrarenal abdominal aortic aneurysm, without rupture: Secondary | ICD-10-CM

## 2023-10-20 MED ORDER — IOPAMIDOL (ISOVUE-370) INJECTION 76%
100.0000 mL | Freq: Once | INTRAVENOUS | Status: AC | PRN
  Administered 2023-10-20: 100 mL via INTRAVENOUS

## 2023-11-05 ENCOUNTER — Encounter: Payer: Self-pay | Admitting: Vascular Surgery

## 2023-11-05 ENCOUNTER — Ambulatory Visit: Attending: Vascular Surgery | Admitting: Vascular Surgery

## 2023-11-05 ENCOUNTER — Telehealth: Payer: Self-pay

## 2023-11-05 VITALS — BP 100/64 | HR 68 | Temp 98.0°F | Ht 72.0 in | Wt 179.0 lb

## 2023-11-05 DIAGNOSIS — I7143 Infrarenal abdominal aortic aneurysm, without rupture: Secondary | ICD-10-CM

## 2023-11-05 NOTE — Telephone Encounter (Signed)
 Per [facility], cardiac clearance needed prior to EVAR scheduling.  Referral sent by L.Stockard.

## 2023-11-05 NOTE — Progress Notes (Signed)
 Patient ID: David Decker, male   DOB: May 20, 1950, 73 y.o.   MRN: [id number]  Reason for Consult: Follow-up   Referred by Pura Lenis, MD  Subjective:     HPI:  David Decker is a 73 y.o. male with history of abdominal aortic aneurysm that has been followed here now with size criteria by duplex for repair.  He has no new back or abdominal pain.  Patient states that he recently had a stress test and is now set to follow-up with cardiology and will possibly need coronary angiogram.  He has a history of 5 bypass surgery in his heart.  He is not having any chest pain or shortness of breath at this time.  Past Medical History:  Diagnosis Date   AAA (abdominal aortic aneurysm) (HCC)    Arthritis    HLD (hyperlipidemia)    HTN (hypertension)    Ischemic heart disease    Obesity    Family History  Problem Relation Age of Onset   Parkinsonism Other    Heart attack Other    Past Surgical History:  Procedure Laterality Date   CORONARY ARTERY BYPASS GRAFT  2003   KNEE SURGERY      Short Social History:  Social History   Tobacco Use   Smoking status: Some Days    Types: Cigars   Smokeless tobacco: Never  Substance Use Topics   Alcohol use: Yes    Comment: occasionally    Allergies  Allergen Reactions   Other Hives    walnuts   Penicillins     Current Outpatient Medications  Medication Sig Dispense Refill   aspirin 81 MG EC tablet Take by mouth.     folic acid (FOLVITE) 1 MG tablet Take 1 mg by mouth daily.     methotrexate (RHEUMATREX) 2.5 MG tablet Take 20 mg by mouth once a week.     rosuvastatin (CRESTOR) 20 MG tablet Take by mouth.     No current facility-administered medications for this visit.    Review of Systems  Constitutional:  Constitutional negative. HENT: HENT negative.  Eyes: Eyes negative.  Respiratory: Respiratory negative.  Cardiovascular: Cardiovascular negative.  GI: Gastrointestinal negative.  Skin: Skin negative.  Neurological:  Positive for light-headedness.  Hematologic: Hematologic/lymphatic negative.  Psychiatric: Psychiatric negative.        Objective:  Objective   Vitals:   11/05/23 0831  BP: 100/64  Pulse: 68  Temp: 98 F (36.7 C)  SpO2: 97%  Weight: 179 lb (81.2 kg)  Height: 6' (1.829 m)   Body mass index is 24.28 kg/m.  Physical Exam HENT:     Head: Normocephalic.  Eyes:     Pupils: Pupils are equal, round, and reactive to light.  Cardiovascular:     Pulses:          Femoral pulses are 2+ on the right side and 2+ on the left side.      Popliteal pulses are 2+ on the right side and 2+ on the left side.  Pulmonary:     Effort: Pulmonary effort is normal.  Abdominal:     General: Abdomen is flat.     Palpations: There is mass.  Musculoskeletal:        General: Normal range of motion.     Right lower leg: No edema.     Left lower leg: No edema.  Skin:    General: Skin is warm.     Capillary Refill: Capillary refill takes less than 2 seconds.  Neurological:     General: No focal deficit present.     Mental Status: He is alert.  Psychiatric:        Mood and Affect: Mood normal.        Thought Content: Thought content normal.        Judgment: Judgment normal.     Data: CT IMPRESSION: VASCULAR   1. Fusiform aneurysmal dilation of the infrarenal abdominal aorta with a maximal diameter of 5.6 cm. Patient is already under the care of a vascular surgeon and this exam is for preoperative planning. Therefore, no specific follow-up recommendations will be given. 2. Of note, relatively large accessory lower pole renal arteries are present bilaterally and arise from the aneurysmal segment of the aorta. 3. Poor evaluation of the iliac and femoral vessels due to delayed passage of contrast material. Cannot evaluate for patency or dissection. 4. Mild aneurysmal dilation of the right common femoral artery with a maximal diameter of 1.9 cm.   NON-VASCULAR   1. Sigmoid colonic  diverticulosis with associated wall thickening and trace inflammatory stranding as well as several prominent adjacent pericolonic lymph nodes. Differential considerations include smoldering diverticulitis versus underlying colonic neoplasm. Recommend referral to gastroenterology if the patient has not had a relatively recent colonoscopy. 2. Probable benign renal cysts bilaterally although evaluation is slightly marred by motion related artifact. Recommend attention on routine follow-up imaging when repeat CT scans are obtained for the abdominal aortic aneurysm. 3. Additional ancillary findings as above.       Assessment/Plan:     73 year old male with 5.6 cm infrarenal abdominal aortic aneurysm.  We discussed the signs and symptoms of rupture as well as his risk.  He is undergoing cardiac workup through his cardiologist at [facility].  We discussed proceeding with endovascular aneurysm repair when he is cleared by cardiology.  We discussed the risk benefits alternatives he demonstrates good understanding.  This will be performed with a Gore conformable device and will schedule when he is cleared by cardiology.    Penne Lonni Colorado MD [facility]

## 2023-11-12 ENCOUNTER — Telehealth: Payer: Self-pay

## 2023-11-12 NOTE — Telephone Encounter (Signed)
 Fax rec'd from Dr Rolla office (cardiology - [facility]).  Dr. Uvaldo determined that patient will need heart cath before proceeding with vascular surgery.  Heart Cath is scheduled for 7/25 and patient will need at least 2 weeks of recovery.  Clearance form was rec'd by Dr. Rolla office and will be sent to [facility] when OK to proceed.

## 2023-11-22 ENCOUNTER — Other Ambulatory Visit (HOSPITAL_COMMUNITY): Payer: Self-pay

## 2023-11-22 MED ORDER — TICAGRELOR 90 MG PO TABS
90.0000 mg | ORAL_TABLET | Freq: Two times a day (BID) | ORAL | 3 refills | Status: AC
  Filled 2023-11-22: qty 60, 30d supply, fill #0

## 2023-11-25 ENCOUNTER — Telehealth (HOSPITAL_COMMUNITY): Payer: Self-pay

## 2023-11-25 NOTE — Telephone Encounter (Signed)
 Outside/paper referral received by Dr. Dorisann from [place]. EKG needed. Insurance benefits and eligibility to be determined.   Called patient to check interest, patient is not interested at this time. Informed patient if they change their mind to call us  back to reopen.  Closing referral.

## 2024-01-14 ENCOUNTER — Telehealth: Payer: Self-pay

## 2024-01-14 NOTE — Telephone Encounter (Signed)
 Per Dr. Valentine [facility]), patient will need cardiac cath before proceeding with EVAR ([facility]).  Dr. Alonna office will send clearance form once Dr. Uvaldo feels it is safe to proceed with EVAR.  Paperwork sent to scanning.

## 2024-02-11 ENCOUNTER — Encounter: Payer: Self-pay | Admitting: Vascular Surgery

## 2024-02-11 ENCOUNTER — Ambulatory Visit: Attending: Vascular Surgery | Admitting: Vascular Surgery

## 2024-02-11 ENCOUNTER — Telehealth: Payer: Self-pay

## 2024-02-11 VITALS — BP 137/84 | HR 69 | Temp 97.8°F | Ht 72.0 in | Wt 176.0 lb

## 2024-02-11 DIAGNOSIS — I7143 Infrarenal abdominal aortic aneurysm, without rupture: Secondary | ICD-10-CM | POA: Diagnosis not present

## 2024-02-11 NOTE — Progress Notes (Signed)
 Patient ID: David Decker, male   DOB: Oct 12, 1950, 73 y.o.   MRN: [id number]  Reason for Consult: Follow-up   Referred by David Lenis, MD  Subjective:     HPI:  David Decker is a 73 y.o. male history of abdominal aortic aneurysm.  He was sent for cardiac clearance has now undergone coronary artery stenting and is on Brilinta  and aspirin.  Plan for Brilinta  until end of October.  He is having significant bruising.  He is back to his usual level of activity playing golf frequently.  He has no new back or abdominal pain.  Past Medical History:  Diagnosis Date   AAA (abdominal aortic aneurysm)    Arthritis    HLD (hyperlipidemia)    HTN (hypertension)    Ischemic heart disease    Obesity    Family History  Problem Relation Age of Onset   Parkinsonism Other    Heart attack Other    Past Surgical History:  Procedure Laterality Date   CORONARY ARTERY BYPASS GRAFT  2003   KNEE SURGERY      Short Social History:  Social History   Tobacco Use   Smoking status: Some Days    Types: Cigars   Smokeless tobacco: Never  Substance Use Topics   Alcohol use: Yes    Comment: occasionally    Allergies  Allergen Reactions   Other Hives    walnuts   Penicillins     Current Outpatient Medications  Medication Sig Dispense Refill   aspirin 81 MG EC tablet Take by mouth.     folic acid (FOLVITE) 1 MG tablet Take 1 mg by mouth daily.     methotrexate (RHEUMATREX) 2.5 MG tablet Take 20 mg by mouth once a week.     rosuvastatin (CRESTOR) 20 MG tablet Take by mouth.     ticagrelor  (BRILINTA ) 90 MG TABS tablet Take 1 tablet (90 mg total) by mouth 2 (two) times daily. 180 tablet 3   No current facility-administered medications for this visit.    Review of Systems  Constitutional:  Constitutional negative. HENT: HENT negative.  Eyes: Eyes negative.  Respiratory: Respiratory negative.  Cardiovascular: Cardiovascular negative.  GI: Gastrointestinal negative.  Skin: Skin  negative.  Neurological: Neurological negative. Hematologic: Positive for bruises/bleeds easily.  Psychiatric: Psychiatric negative.        Objective:  Objective   Vitals:   02/11/24 0919  BP: 137/84  Pulse: 69  Temp: 97.8 F (36.6 C)  SpO2: 98%  Weight: 176 lb (79.8 kg)  Height: 6' (1.829 m)   Body mass index is 23.87 kg/m.  Physical Exam HENT:     Head: Normocephalic.     Nose: Nose normal.     Mouth/Throat:     Mouth: Mucous membranes are moist.  Cardiovascular:     Rate and Rhythm: Normal rate.  Pulmonary:     Effort: Pulmonary effort is normal.  Abdominal:     General: Abdomen is flat.     Palpations: Abdomen is soft. There is mass.  Musculoskeletal:        General: Normal range of motion.     Cervical back: Normal range of motion.     Right lower leg: No edema.     Left lower leg: No edema.  Skin:    General: Skin is warm.     Capillary Refill: Capillary refill takes less than 2 seconds.  Neurological:     General: No focal deficit present.     Mental  Status: He is alert.  Psychiatric:        Mood and Affect: Mood normal.     Data: CT IMPRESSION: VASCULAR   1. Fusiform aneurysmal dilation of the infrarenal abdominal aorta with a maximal diameter of 5.6 cm. Patient is already under the care of a vascular surgeon and this exam is for preoperative planning. Therefore, no specific follow-up recommendations will be given. 2. Of note, relatively large accessory lower pole renal arteries are present bilaterally and arise from the aneurysmal segment of the aorta. 3. Poor evaluation of the iliac and femoral vessels due to delayed passage of contrast material. Cannot evaluate for patency or dissection. 4. Mild aneurysmal dilation of the right common femoral artery with a maximal diameter of 1.9 cm.     Assessment/Plan:     74 year old male with 5.6 cm abdominal aortic.  He has now undergone coronary stenting.  Will need to hold Brilinta  for EVAR.   We again discussed the signs and symptoms of rupture.  We discussed surgical intervention to include endovascular aneurysm repair as well as the potential risk and benefits.  He demonstrates good understanding we will get him scheduled after cardiac clearance by his primary cardiologist at [facility].     David Lonni Colorado MD [facility]

## 2024-02-11 NOTE — Telephone Encounter (Signed)
 Clearance request faxed to Dr. Rolla office [facility]. Need to stop Brilinta  5 days prior to surgery with BCC.

## 2024-03-04 ENCOUNTER — Telehealth: Payer: Self-pay

## 2024-03-04 NOTE — Telephone Encounter (Signed)
 Rec'd cardiac clearance letter from Dr. Uvaldo [facility]) re: patient and surgery planning.  Per cardiology, patient was recently switched to Plavix.  Needs to be on continuous therapy for at least 3 months prior to surgery with Dr. Sheree. Approx surgery planning time is March 2027.

## 2024-03-04 NOTE — Telephone Encounter (Signed)
 EDIT to last entry - March 2026

## 2024-03-09 ENCOUNTER — Telehealth: Payer: Self-pay

## 2024-03-09 NOTE — Telephone Encounter (Signed)
 Spoke to patient who stated that he was provided Plavix by Dr. Valentine but has not started yet because he is wanting to have surgery with Dr. Sheree.  Dr Alonna note states that patient must be on 3 months of continuous Plavix therapy prior to being off of Plavix. This nurse redirected patient to call Dr. Alonna office for clarification.

## 2024-05-18 ENCOUNTER — Other Ambulatory Visit: Payer: Self-pay

## 2024-05-18 DIAGNOSIS — I7143 Infrarenal abdominal aortic aneurysm, without rupture: Secondary | ICD-10-CM

## 2024-06-02 ENCOUNTER — Encounter (HOSPITAL_COMMUNITY): Payer: Self-pay

## 2024-06-02 NOTE — Pre-Procedure Instructions (Signed)
 Surgical Instructions   Your procedure is scheduled on June 08, 2024. Report to [facility] Main Entrance A at 5:30 A.M., then check in with the Admitting office. Any questions or running late day of surgery: call [phone]  Questions prior to your surgery date: call [phone], Monday-Friday, 8am-4pm. If you experience any cold or flu symptoms such as cough, fever, chills, shortness of breath, etc. between now and your scheduled surgery, please notify us  at the above number.     Remember:  Do not eat or drink after midnight the night before your surgery    Take these medicines the morning of surgery with A SIP OF WATER: metoprolol succinate (TOPROL-XL)  rosuvastatin (CRESTOR)    May take these medicines IF NEEDED: fluticasone (FLONASE) nasal spray    STOP taking your clopidogrel (PLAVIX) five days prior to surgery. Your last dose will be February 4th.  Please follow your prescribing physician's instructions regarding stopping your methotrexate (RHEUMATREX).   One week prior to surgery, STOP taking any Aspirin (unless otherwise instructed by your surgeon) Aleve, Naproxen, Ibuprofen, Motrin, Advil, Goody's, BC's, all herbal medications, fish oil, and non-prescription vitamins.                     Do NOT Smoke (Tobacco/Vaping) for 24 hours prior to your procedure.  If you use a CPAP at night, you may bring your mask/headgear for your overnight stay.   You will be asked to remove any contacts, glasses, piercing's, hearing aid's, dentures/partials prior to surgery. Please bring cases for these items if needed.    Your surgeon will determine if you are to be admitted or discharged the same day.  Patients discharged the day of surgery will not be allowed to drive home, and someone needs to stay with them for 24 hours.  SURGICAL WAITING ROOM VISITATION Patients may have no more than 2 support people in the waiting area - these visitors may rotate.   Pre-op nurse will  coordinate an appropriate time for 2 ADULT support persons, who may not rotate, to accompany patient in pre-op.  Children under the age of 53 must have an adult with them who is not the patient and must remain in the main waiting area with an adult.  If the patient needs to stay at the hospital during part of their recovery, the visitor guidelines for inpatient rooms apply.  Please refer to the [facility] website for the visitor guidelines for any additional information.   If you received a COVID test during your pre-op visit  it is requested that you wear a mask when out in public, stay away from anyone that may not be feeling well and notify your surgeon if you develop symptoms. If you have been in contact with anyone that has tested positive in the last 10 days please notify you surgeon.      Pre-operative CHG Bathing Instructions   You can play a key role in reducing the risk of infection after surgery. Your skin needs to be as free of germs as possible. You can reduce the number of germs on your skin by washing with CHG (chlorhexidine gluconate) soap before surgery. CHG is an antiseptic soap that kills germs and continues to kill germs even after washing.   DO NOT use if you have an allergy to chlorhexidine/CHG or antibacterial soaps. If your skin becomes reddened or irritated, stop using the CHG and notify one of our RNs at [phone].  TAKE A SHOWER THE NIGHT BEFORE SURGERY   Please keep in mind the following:  DO NOT shave, including legs and underarms, 48 hours prior to surgery.   You may shave your face before/day of surgery.  Place clean sheets on your bed the night before surgery Use a clean washcloth (not used since being washed) for shower. DO NOT sleep with pet's night before surgery.  CHG Shower Instructions:  Wash your face and private area with normal soap. If you choose to wash your hair, wash first with your normal shampoo.  After you use shampoo/soap,  rinse your hair and body thoroughly to remove shampoo/soap residue.  Turn the water OFF and apply half the bottle of CHG soap to a CLEAN washcloth.  Apply CHG soap ONLY FROM YOUR NECK DOWN TO YOUR TOES (washing for 3-5 minutes)  DO NOT use CHG soap on face, private areas, open wounds, or sores.  Pay special attention to the area where your surgery is being performed.  If you are having back surgery, having someone wash your back for you may be helpful. Wait 2 minutes after CHG soap is applied, then you may rinse off the CHG soap.  Pat dry with a clean towel  Put on clean pajamas    Additional instructions for the day of surgery: If you choose, you may shower the morning of surgery with an antibacterial soap.  DO NOT APPLY any lotions, deodorants, cologne, or perfumes.   Do not wear jewelry or makeup Do not wear nail polish, gel polish, artificial nails, or any other type of covering on natural nails (fingers and toes) Do not bring valuables to the hospital. [facility] is not responsible for valuables/personal belongings. Put on clean/comfortable clothes.  Please brush your teeth.  Ask your nurse before applying any prescription medications to the skin.

## 2024-06-03 ENCOUNTER — Other Ambulatory Visit: Payer: Self-pay

## 2024-06-03 ENCOUNTER — Encounter (HOSPITAL_COMMUNITY): Payer: Self-pay

## 2024-06-03 ENCOUNTER — Inpatient Hospital Stay (HOSPITAL_COMMUNITY): Admission: RE | Admit: 2024-06-03 | Discharge: 2024-06-03 | Attending: Vascular Surgery

## 2024-06-03 VITALS — BP 148/93 | HR 89 | Temp 98.4°F | Resp 18 | Ht 75.0 in | Wt 187.0 lb

## 2024-06-03 DIAGNOSIS — I7143 Infrarenal abdominal aortic aneurysm, without rupture: Secondary | ICD-10-CM

## 2024-06-03 DIAGNOSIS — Z01818 Encounter for other preprocedural examination: Secondary | ICD-10-CM

## 2024-06-03 HISTORY — DX: Peripheral vascular disease, unspecified: I73.9

## 2024-06-03 HISTORY — DX: Acute myocardial infarction, unspecified: I21.9

## 2024-06-03 HISTORY — DX: Atherosclerotic heart disease of native coronary artery without angina pectoris: I25.10

## 2024-06-03 LAB — TYPE AND SCREEN
ABO/RH(D): A NEG
Antibody Screen: NEGATIVE

## 2024-06-03 LAB — COMPREHENSIVE METABOLIC PANEL WITH GFR
ALT: 17 U/L (ref 0–44)
AST: 22 U/L (ref 15–41)
Albumin: 4.4 g/dL (ref 3.5–5.0)
Alkaline Phosphatase: 74 U/L (ref 38–126)
Anion gap: 12 (ref 5–15)
BUN: 11 mg/dL (ref 8–23)
CO2: 27 mmol/L (ref 22–32)
Calcium: 9.8 mg/dL (ref 8.9–10.3)
Chloride: 101 mmol/L (ref 98–111)
Creatinine, Ser: 1.01 mg/dL (ref 0.61–1.24)
GFR, Estimated: >60 mL/min
Glucose, Bld: 107 mg/dL — ABNORMAL HIGH (ref 70–99)
Potassium: 4 mmol/L (ref 3.5–5.1)
Sodium: 140 mmol/L (ref 135–145)
Total Bilirubin: 0.9 mg/dL (ref 0.0–1.2)
Total Protein: 7.6 g/dL (ref 6.5–8.1)

## 2024-06-03 LAB — CBC
HCT: 45.4 % (ref 39.0–52.0)
Hemoglobin: 15.5 g/dL (ref 13.0–17.0)
MCH: 32.6 pg (ref 26.0–34.0)
MCHC: 34.1 g/dL (ref 30.0–36.0)
MCV: 95.6 fL (ref 80.0–100.0)
Platelets: 397 10*3/uL (ref 150–400)
RBC: 4.75 MIL/uL (ref 4.22–5.81)
RDW: 14 % (ref 11.5–15.5)
WBC: 10.4 10*3/uL (ref 4.0–10.5)
nRBC: 0 % (ref 0.0–0.2)

## 2024-06-03 LAB — SURGICAL PCR SCREEN
MRSA, PCR: NEGATIVE
Staphylococcus aureus: NEGATIVE

## 2024-06-03 LAB — APTT: aPTT: 23 s — ABNORMAL LOW (ref 24–36)

## 2024-06-03 LAB — PROTIME-INR
INR: 1 (ref 0.8–1.2)
Prothrombin Time: 13.6 s (ref 11.4–15.2)

## 2024-06-03 NOTE — Progress Notes (Signed)
 Patient unable to provide urine sample at PAT appointment. Will need to be collected DOS. Order modified.

## 2024-06-03 NOTE — Progress Notes (Signed)
 PCP - Dr. Alm Bilis Cardiologist - Dr. Marcello Lennox - last office visit 03/03/2024  PPM/ICD - Denies Device Orders - n/a Rep Notified - n/a  Chest x-ray - n/a EKG - 11/22/2023 (CE) - tracing requested Stress Test - 10/22/2023 (CE) ECHO - 09/29/2023 (CE) Cardiac Cath - 11/21/2023 (CE)  Sleep Study - Denies CPAP - n/a  No DM  Last dose of GLP1 agonist- n/a GLP1 instructions: n/a  Blood Thinner Instructions: Pt instructed to stop Plavix 5 days prior to surgery. Pt last dose was February 2nd. Aspirin Instructions: n/a  NPO after midnight  COVID TEST- n/a   Anesthesia review: Yes. Cardiac clearance. Pt had recent PCI July 2025 with prior PCI and CABG x5. Pt stopped Brilinta  prematurely on his own in October 2025 due to bruising. Per office note 02/2024, cardiologist wanted pt to be on Plavix for at least three months prior to proceeding with VVS surgery.  Patient denies shortness of breath, fever, cough and chest pain at PAT appointment. Pt endorses bronchitis in October 2025 and completed a prednisone course. He still has a dry, nonproductive cough but denies any other symptoms and his activity is not limited due to cough. Discussed with Lynwood Hope, PA-C and patient okay to proceed with surgery next week.   All instructions explained to the patient, with a verbal understanding of the material. Patient agrees to go over the instructions while at home for a better understanding. Patient also instructed to self quarantine after being tested for COVID-19. The opportunity to ask questions was provided.

## 2024-06-04 NOTE — Progress Notes (Signed)
 Anesthesia Chart Review:  74 year old male follows with cardiology for history of CAD s/p CABG 2003 (LIMA to LAD, SVG to RCA), PAD with known 5.6 cm infrarenal AAA, HLD, HTN. He had nuclear stress test completed on 10/22/2023 in the setting of atypical angina.  As a follow-up of positive nuclear stress test, he underwent cardiac catheterization on 11/20/2023 which revealed severe native three-vessel obstructive coronary artery disease with high-grade- distal left main to ostium of circumflex, CTO of proximal LAD, 85% ostial RCA to proximal RCA lesion, 100% mid RCA to distal RCA lesion.  LIMA to LAD was widely patent.  Venous graft to RCA was 100% occluded.  By cardiac catheterization the patient was found to have LV ejection fraction of 45 to 50%. Patient underwent complex percutaneous coronary intervention of distal left main coronary artery and ostial proximal circumflex with angioplasty, shockwave intracoronary lithotripsy, and stenting of mid to distal left main coronary artery into ostial left circumflex artery.    Last seen in follow-up by cardiologist Dr. Uvaldo 03/03/2024 for upcoming surgery was discussed.  Per note,  From CAD standpoint, he is status post PCI of distal left main into circumflex on 11/21/2023.  He is currently on aspirin.  He has stopped Brilinta  ON HIS OWN almost 10 days ago.  He had issue with upper extremity ecchymosis on Brilinta .  No internal bleeding.  He is in agreement to switching Brilinta  to Plavix. Echocardiogram shows EF of 45 to 50%, continue Toprol-XL, low-dose lisinopril.  Clinically, he is not in heart failure. He says he is really worried about  abdominal aortic aneurysm which is now of 5.6 cm size.  There is potential plan for endovascular surgery in future.  In terms of timing for surgery, it has been 3 months since PCI.  But he has already stopped taking Brilinta , for more than 10 days due to skin bruises.  No anginal symptoms are reported while off Brilinta . He is in  agreement to starting on Plavix from today. If there is no significant vascular symptoms or in absence of rapid increment in size of aneurysm, would prefer waiting to hold Plavix 3 more months.  But if there is concern for rapid expansion or high probability of rupture, Vascular procedure be done sooner after holding Plavix for 5 to 7 days.  Patient seen by PCP 04/02/2024 with persistent cough for ~2 weeks.  Lungs were noted to be clear, no wheezing.  He was prescribed prednisone, Tessalon, Mucinex.  Patient reports symptoms have improved significantly but he still has an occasional dry cough.  Denies any other respiratory symptoms.  Says he is otherwise at baseline and feels well.  Patient reports last dose Plavix 05/31/24.  Preop labs reviewed, WNL.  EKG 11/22/2023 (copy scanned in media): Sinus rhythm with marked sinus rhythm with occasional PVCs.  Rate 62.  Septal infarct.  Cath 11/21/2023 (Care Everywhere): FINDINGS:  Hemodynamics  1. Aortic Pressure -132/69 mmHg.  Mean 88 mmHg  2. Left Ventricular -138/8 mmHg  Additional comments on on hemodynamics:  LVEDP 15  Aortic valve gradient-no significant gradient   Coronary Angiography  Anatomically right dominant   Coronary arteries were heavily calcified including the left main coronary  artery, ostial proximal left circumflex, proximal to mid LAD.  Right  coronary artery was heavily calcified from the proximal to the mid vessel.    Left Main: Large vessel.  Mild angiographic disease in the proximal to  mid segment.  80-85 % stenosis at the distal left main coronary artery  extending into the ostial segment of the left circumflex.  Left anterior descending artery: 100% occluded at the ostium.  Diagonal branches: Small, arises off of the proximal to mid LAD-backfills  via patent LIMA graft to the LAD.  Mild irregularities in the diagonal  branch.  Left Circumflex: Anatomically nondominant.  Large vessel.  Ostial stenosis  of 85%.   Poststenotic ectasia in the proximal segment just beyond the  ostium.  Proximal eccentric stenosis of 75 to 80%.SABRA  Heavy calcification  within this segment.  Mid segment has mild angiographic disease.   Bifurcates in the midsegment into 2 marginal branches.  The ongoing left  circumflex is small, tortuous with diffuse irregularities.  Obtuse Marginal branches: -First marginal branch is small to moderate in  the proximal segment and has mild disease irregularities.  High-frequency  tortuosity in the mid to distal segment.  Second marginal branch is small  with mild diffuse irregularities.  High-frequency tortuosity in the mid to  distal segment.  Right Coronary Artery: Anatomically dominant.  85% proximal segment, 50 to  70% in the proximal to mid segment occluded in the midsegment above the  acute margin.  Posterior Descending Artery: Fills retrograde via LAD septal perforators  (via LIMA graft)   Graft angiography:  A single aorto coronary graft marker is noted on the right ascending  aorta.    SVG-RCA: 100% occluded at the ostial segment.  Unclear if this was a  single graft of the distal right coronary artery or if it was a sequence  graft.  Appears chronically occluded.  LIMA to LAD: Widely patent.  Fills the LAD antegrade retrograde.  The  ostium and the anastomosis-free of any angiographic disease.   - The antegrade limb of the LAD has mild diffuse irregularities.  No focal  obstructive stenosis.  Distal LAD provides collaterals to the posterior  descending artery.  The PDA appears to be small.  - The retrograde limb of the LAD has moderate diffuse irregularities.  It  backfills a small diagonal branch that has multiple branches.  Also fills  multiple septal perforators    Left Ventriculography -  Normal left ventricular chamber dimension.  Left-ventricular ejection fraction appears to be 45-50 % with mild diffuse  hypokinesia with moderate hypokinesia of the anterolateral  wall segment.    IMPRESSION:  Severe native three-vessel obstructive coronary artery disease  - High-grade calcific obstructive distal left main coronary artery  stenosis into the ostium of the left circumflex, severe calcific  obstructive stenosis of proximal left circumflex, chronic total occlusion  of the ostial proximal LAD, chronic total occlusion of mid to distal RCA  History of CABG-around 2005  - Single patent graft -LIMA to LAD  - No obstructive disease in LAD beyond graft insertion.  Supplies  collaterals to distal RCA PDA  Dyspnea on exertion-anginal equivalent-with culprit vessel distal left  main, ostial proximal circumflex  CONCLUSIONS:  Severe progressive dyspnea on exertion, anginal equivalent  Severe native left main and three-vessel obstructive coronary artery  disease with chronic total occlusion of the LAD and right coronary artery.   Previous CABG with identifiable grafts including LIMA to LAD and SVG to  RCA.  Reportedly had 5 vessel CABG but only a single graft marker on the  ascending aorta.  Graft to the right coronary artery is occluded.   Circumflex he did not appear to have been previously grafted.  LIMA to LAD  was widely patent.   Successful complex coronary intervention of the  distal left main coronary  artery ostial proximal and proximal left circumflex with intravascular  ultrasound-guided intracoronary lithotripsy angioplasty and stenting of  the mid to distal left main coronary artery, ostial proximal circumflex  and proximal to mid circumflex.   RECOMMENDATIONS:  Dual antiplatelet therapy with Brilinta  and aspirin at the present time.   Can ultimately transition to maintenance dose of Brilinta  with aspirin or  Plavix with aspirin for long-term therapy.  Continue aggressive risk factor modification and guideline directed  medical therapies.     Lynwood Geofm RIGGERS [facility]/Anesthesiology Phone [phone] 06/04/2024 2:39  PM

## 2024-06-04 NOTE — Anesthesia Preprocedure Evaluation (Signed)
 "                                  Anesthesia Evaluation    Airway        Dental   Pulmonary Current Smoker          Cardiovascular      Neuro/Psych    GI/Hepatic   Endo/Other    Renal/GU      Musculoskeletal   Abdominal   Peds  Hematology   Anesthesia Other Findings   Reproductive/Obstetrics                              Anesthesia Physical Anesthesia Plan  ASA:   Anesthesia Plan:    Post-op Pain Management:    Induction:   PONV Risk Score and Plan:   Airway Management Planned:   Additional Equipment:   Intra-op Plan:   Post-operative Plan:   Informed Consent:   Plan Discussed with:   Anesthesia Plan Comments: (PAT note by Lynwood Hope, PA-C: 74 year old male follows with cardiology for history of CAD s/p CABG 2003 (LIMA to LAD, SVG to RCA), PAD with known 5.6 cm infrarenal AAA, HLD, HTN. He had nuclear stress test completed on 10/22/2023 in the setting of atypical angina.  As a follow-up of positive nuclear stress test, he underwent cardiac catheterization on 11/20/2023 which revealed severe native three-vessel obstructive coronary artery disease with high-grade- distal left main to ostium of circumflex, CTO of proximal LAD, 85% ostial RCA to proximal RCA lesion, 100% mid RCA to distal RCA lesion.  LIMA to LAD was widely patent.  Venous graft to RCA was 100% occluded.  By cardiac catheterization the patient was found to have LV ejection fraction of 45 to 50%. Patient underwent complex percutaneous coronary intervention of distal left main coronary artery and ostial proximal circumflex with angioplasty, shockwave intracoronary lithotripsy, and stenting of mid to distal left main coronary artery into ostial left circumflex artery.    Last seen in follow-up by cardiologist Dr. Uvaldo 03/03/2024 for upcoming surgery was discussed.  Per note,  From CAD standpoint, he is status post PCI of distal left main into circumflex on  11/21/2023.  He is currently on aspirin.  He has stopped Brilinta  ON HIS OWN almost 10 days ago.  He had issue with upper extremity ecchymosis on Brilinta .  No internal bleeding.  He is in agreement to switching Brilinta  to Plavix. Echocardiogram shows EF of 45 to 50%, continue Toprol-XL, low-dose lisinopril.  Clinically, he is not in heart failure. He says he is really worried about  abdominal aortic aneurysm which is now of 5.6 cm size.  There is potential plan for endovascular surgery in future.  In terms of timing for surgery, it has been 3 months since PCI.  But he has already stopped taking Brilinta , for more than 10 days due to skin bruises.  No anginal symptoms are reported while off Brilinta . He is in agreement to starting on Plavix from today. If there is no significant vascular symptoms or in absence of rapid increment in size of aneurysm, would prefer waiting to hold Plavix 3 more months.  But if there is concern for rapid expansion or high probability of rupture, Vascular procedure be done sooner after holding Plavix for 5 to 7 days.  Patient seen by PCP 04/02/2024 with persistent cough for ~2 weeks.  Lungs were  noted to be clear, no wheezing.  He was prescribed prednisone, Tessalon, Mucinex.  Patient reports symptoms have improved significantly but he still has an occasional dry cough.  Denies any other respiratory symptoms.  Says he is otherwise at baseline and feels well.  Patient reports last dose Plavix 05/31/24.  Preop labs reviewed, WNL.  EKG 11/22/2023 (copy scanned in media): Sinus rhythm with marked sinus rhythm with occasional PVCs.  Rate 62.  Septal infarct.  Cath 11/21/2023 (Care Everywhere): FINDINGS:  Hemodynamics  1. Aortic Pressure -132/69 mmHg.  Mean 88 mmHg  2. Left Ventricular -138/8 mmHg  Additional comments on on hemodynamics:  LVEDP 15  Aortic valve gradient-no significant gradient   Coronary Angiography  Anatomically right dominant   Coronary arteries were  heavily calcified including the left main coronary  artery, ostial proximal left circumflex, proximal to mid LAD.  Right  coronary artery was heavily calcified from the proximal to the mid vessel.    Left Main: Large vessel.  Mild angiographic disease in the proximal to  mid segment.  80-85 % stenosis at the distal left main coronary artery  extending into the ostial segment of the left circumflex.  Left anterior descending artery: 100% occluded at the ostium.  Diagonal branches: Small, arises off of the proximal to mid LAD-backfills  via patent LIMA graft to the LAD.  Mild irregularities in the diagonal  branch.  Left Circumflex: Anatomically nondominant.  Large vessel.  Ostial stenosis  of 85%.  Poststenotic ectasia in the proximal segment just beyond the  ostium.  Proximal eccentric stenosis of 75 to 80%.SABRA  Heavy calcification  within this segment.  Mid segment has mild angiographic disease.   Bifurcates in the midsegment into 2 marginal branches.  The ongoing left  circumflex is small, tortuous with diffuse irregularities.  Obtuse Marginal branches: -First marginal branch is small to moderate in  the proximal segment and has mild disease irregularities.  High-frequency  tortuosity in the mid to distal segment.  Second marginal branch is small  with mild diffuse irregularities.  High-frequency tortuosity in the mid to  distal segment.  Right Coronary Artery: Anatomically dominant.  85% proximal segment, 50 to  70% in the proximal to mid segment occluded in the midsegment above the  acute margin.  Posterior Descending Artery: Fills retrograde via LAD septal perforators  (via LIMA graft)   Graft angiography:  A single aorto coronary graft marker is noted on the right ascending  aorta.    SVG-RCA: 100% occluded at the ostial segment.  Unclear if this was a  single graft of the distal right coronary artery or if it was a sequence  graft.  Appears chronically occluded.  LIMA to LAD:  Widely patent.  Fills the LAD antegrade retrograde.  The  ostium and the anastomosis-free of any angiographic disease.   - The antegrade limb of the LAD has mild diffuse irregularities.  No focal  obstructive stenosis.  Distal LAD provides collaterals to the posterior  descending artery.  The PDA appears to be small.  - The retrograde limb of the LAD has moderate diffuse irregularities.  It  backfills a small diagonal branch that has multiple branches.  Also fills  multiple septal perforators    Left Ventriculography -  Normal left ventricular chamber dimension.  Left-ventricular ejection fraction appears to be 45-50 % with mild diffuse  hypokinesia with moderate hypokinesia of the anterolateral wall segment.    IMPRESSION:  Severe native three-vessel obstructive coronary artery disease  -  High-grade calcific obstructive distal left main coronary artery  stenosis into the ostium of the left circumflex, severe calcific  obstructive stenosis of proximal left circumflex, chronic total occlusion  of the ostial proximal LAD, chronic total occlusion of mid to distal RCA  History of CABG-around 2005  - Single patent graft -LIMA to LAD  - No obstructive disease in LAD beyond graft insertion.  Supplies  collaterals to distal RCA PDA  Dyspnea on exertion-anginal equivalent-with culprit vessel distal left  main, ostial proximal circumflex  CONCLUSIONS:  Severe progressive dyspnea on exertion, anginal equivalent  Severe native left main and three-vessel obstructive coronary artery  disease with chronic total occlusion of the LAD and right coronary artery.   Previous CABG with identifiable grafts including LIMA to LAD and SVG to  RCA.  Reportedly had 5 vessel CABG but only a single graft marker on the  ascending aorta.  Graft to the right coronary artery is occluded.   Circumflex he did not appear to have been previously grafted.  LIMA to LAD  was widely patent.   Successful complex coronary  intervention of the distal left main coronary  artery ostial proximal and proximal left circumflex with intravascular  ultrasound-guided intracoronary lithotripsy angioplasty and stenting of  the mid to distal left main coronary artery, ostial proximal circumflex  and proximal to mid circumflex.   RECOMMENDATIONS:  Dual antiplatelet therapy with Brilinta  and aspirin at the present time.   Can ultimately transition to maintenance dose of Brilinta  with aspirin or  Plavix with aspirin for long-term therapy.  Continue aggressive risk factor modification and guideline directed  medical therapies.    )         Anesthesia Quick Evaluation  "

## 2024-06-08 ENCOUNTER — Inpatient Hospital Stay (HOSPITAL_COMMUNITY): Admission: RE | Admit: 2024-06-08 | Admitting: Vascular Surgery

## 2024-06-08 ENCOUNTER — Encounter (HOSPITAL_COMMUNITY): Admission: RE | Payer: Self-pay | Source: Home / Self Care

## 2024-06-08 ENCOUNTER — Encounter (HOSPITAL_COMMUNITY): Payer: Self-pay | Admitting: Physician Assistant

## 2024-06-08 SURGERY — INSERTION, ENDOVASCULAR STENT GRAFT, AORTA, ABDOMINAL
Anesthesia: General
